# Patient Record
Sex: Female | Born: 1985 | Race: White | Hispanic: No | Marital: Single | Smoking: Current every day smoker
Health system: Southern US, Community
[De-identification: ages and names within clinical notes are randomized; demographics above are authoritative.]

## PROBLEM LIST (undated history)

## (undated) DIAGNOSIS — E041 Nontoxic single thyroid nodule: Secondary | ICD-10-CM

## (undated) DIAGNOSIS — C801 Malignant (primary) neoplasm, unspecified: Secondary | ICD-10-CM

## (undated) DIAGNOSIS — R51 Headache: Secondary | ICD-10-CM

## (undated) HISTORY — PX: WISDOM TOOTH EXTRACTION: SHX21

## (undated) HISTORY — PX: GALLBLADDER SURGERY: SHX652

---

## 2013-10-09 ENCOUNTER — Ambulatory Visit: Payer: Medicaid Other | Admitting: Endocrinology

## 2013-10-12 ENCOUNTER — Encounter: Payer: Self-pay | Admitting: Endocrinology

## 2013-10-12 ENCOUNTER — Ambulatory Visit (INDEPENDENT_AMBULATORY_CARE_PROVIDER_SITE_OTHER): Payer: Medicaid Other | Admitting: Endocrinology

## 2013-10-12 VITALS — BP 118/80 | HR 85 | Temp 98.0°F | Ht 64.5 in | Wt 191.0 lb

## 2013-10-12 DIAGNOSIS — E042 Nontoxic multinodular goiter: Secondary | ICD-10-CM | POA: Insufficient documentation

## 2013-10-12 DIAGNOSIS — F17200 Nicotine dependence, unspecified, uncomplicated: Secondary | ICD-10-CM

## 2013-10-12 LAB — TSH: TSH: 2.22 u[IU]/mL (ref 0.35–5.50)

## 2013-10-12 LAB — T4, FREE: FREE T4: 0.66 ng/dL (ref 0.60–1.60)

## 2013-10-12 NOTE — Patient Instructions (Signed)
Please sign a release of information, for the ultrasound done last year. blood tests are being requested for you today.  We'll contact you with results. If the nodules are really small, and the blood test is normal, no treatment is needed now. However, please return in 1 year.

## 2013-10-12 NOTE — Progress Notes (Signed)
   Subjective:    Patient ID: Lisa Kirby, female    DOB: [birth year], 28 y.o.   MRN: [id number]  HPI In early 2014, pt was noted on a routine visit to have a multinodular goiter.  She saw an endocrinologist in [place].  bx was advised, but did not return for f/u.  She had a blood test, but does not recall the result.  She has slight pain at the anterior neck and assoc mild dysphagia.  She has never been on thyroid medication.   No past medical history on file.  No past surgical history on file.  History   Social History  . Marital Status: Unknown    Spouse Name: N/A    Number of Children: N/A  . Years of Education: N/A   Occupational History  . Not on file.   Social History Main Topics  . Smoking status: Current Every Day Smoker  . Smokeless tobacco: Not on file  . Alcohol Use: Yes  . Drug Use: Not on file  . Sexual Activity: Not on file   Other Topics Concern  . Not on file   Social History Narrative  . No narrative on file    No current outpatient prescriptions on file prior to visit.   No current facility-administered medications on file prior to visit.    Not on File  No family history on file. No goiter or other thyroid problems. BP 118/80  Pulse 85  Temp(Src) 98 F (36.7 C) (Oral)  Ht 5' 4.5" (1.638 m)  Wt 191 lb (86.637 kg)  BMI 32.29 kg/m2  SpO2 98%  Review of Systems denies weight loss, hoarseness, visual loss, palpitations, sob, polyuria, myalgias, excessive diaphoresis, numbness, tremor, anxiety, easy bruising, and rhinorrhea.  She has headache, hair loss, and constipation.  She has regular menses.      Objective:   Physical Exam VS: see vs page GEN: no distress HEAD: head: no deformity eyes: no periorbital swelling, no proptosis external nose and ears are normal mouth: no lesion seen NECK: thyroid is slightly enlarged (R>L), but I cannot find a nodule. Surface is irregular.   CHEST WALL: no deformity LUNGS:  Clear to auscultation CV: reg  rate and rhythm, no murmur ABD: abdomen is soft, nontender.  no hepatosplenomegaly.  not distended.  no hernia MUSCULOSKELETAL: muscle bulk and strength are grossly normal.  no obvious joint swelling.  gait is normal and steady EXTEMITIES: no deformity. no edema PULSES: no carotid bruit NEURO:  cn 2-12 grossly intact.   readily moves all 4's.  sensation is intact to touch on all 4's.  SKIN:  Normal texture and temperature.  No rash or suspicious lesion is visible.   NODES:  None palpable at the neck PSYCH: alert, well-oriented.  Does not appear anxious nor depressed.  Lab Results  Component Value Date   TSH 2.22 10/12/2013  (I reviewed [place] report)    Assessment & Plan:  Multinodular goiter, due for recheck. Neck pain: not thyroid-related Hair loss: not thyroid-related

## 2013-10-20 ENCOUNTER — Other Ambulatory Visit: Payer: Self-pay | Admitting: Endocrinology

## 2013-10-20 ENCOUNTER — Ambulatory Visit
Admission: RE | Admit: 2013-10-20 | Discharge: 2013-10-20 | Disposition: A | Discharge status: Home / Self Care | Payer: Medicaid Other | Source: Ambulatory Visit | Attending: Endocrinology | Admitting: Endocrinology

## 2013-10-20 DIAGNOSIS — E042 Nontoxic multinodular goiter: Secondary | ICD-10-CM

## 2013-10-28 ENCOUNTER — Telehealth: Payer: Self-pay | Admitting: *Deleted

## 2013-10-29 ENCOUNTER — Telehealth: Payer: Self-pay

## 2013-10-29 NOTE — Telephone Encounter (Signed)
Pt called stating the referral for Thyroid Bx should have been sent to [facility].  Can this be changed? Thanks!

## 2013-10-29 NOTE — Telephone Encounter (Signed)
Contacted pt and informed via voicemail that her referral had been sent to [facility]. Left number for pt to call to see about appointment and instructed to call back if she had any questions.

## 2013-11-10 NOTE — Telephone Encounter (Signed)
Pt would like an urgent referral send to [facility] center regarding the biopsy please

## 2013-11-10 NOTE — Telephone Encounter (Signed)
Pt informed that since she has [place] [facility] the referral would not be able to be made at [facility] by our referral coordinators. Pt advised to check with her PCP about making the referral to [facility] medical. Pt to call back and inform us up her decision and what PCP says.

## 2013-11-20 ENCOUNTER — Telehealth: Payer: Self-pay | Admitting: *Deleted

## 2013-11-20 DIAGNOSIS — E042 Nontoxic multinodular goiter: Secondary | ICD-10-CM

## 2013-11-20 NOTE — Telephone Encounter (Signed)
Mandy with [facility] (pt's PCP office) called stating the physician received the copy of the thyroid U/S. Pt wants to have her thyroid biopsy done at [facility]. Can Dr Loanne Drilling please order this to be done there? Please advise.

## 2013-11-20 NOTE — Telephone Encounter (Signed)
done

## 2013-11-24 ENCOUNTER — Telehealth: Payer: Self-pay | Admitting: Endocrinology

## 2013-11-24 NOTE — Telephone Encounter (Signed)
Patient states that her Doctor Chalmers Guest told her that we would have to schedule her biopsy in [place]  Can you please advise patient  Call back: [phone]  Thank You

## 2013-11-24 NOTE — Telephone Encounter (Signed)
Requested call back from [place] to discuss.

## 2013-11-25 NOTE — Telephone Encounter (Signed)
Pt advised that if Bx is going to be done in [place] the referral must come from them and they must schedule it.

## 2013-11-26 ENCOUNTER — Telehealth: Payer: Self-pay | Admitting: Endocrinology

## 2013-11-26 DIAGNOSIS — E042 Nontoxic multinodular goiter: Secondary | ICD-10-CM

## 2013-11-26 NOTE — Telephone Encounter (Signed)
Left message for [facility] imaging to call back to reschedule appointment.

## 2013-11-26 NOTE — Telephone Encounter (Signed)
Patient would like to schedule her Biopsy here in [facility]   Please advise patient   [phone]  Thank You

## 2013-12-01 ENCOUNTER — Other Ambulatory Visit (HOSPITAL_COMMUNITY)
Admission: RE | Admit: 2013-12-01 | Discharge: 2013-12-01 | Disposition: A | Discharge status: Home / Self Care | Payer: Medicaid Other | Source: Ambulatory Visit | Attending: Interventional Radiology | Admitting: Interventional Radiology

## 2013-12-01 ENCOUNTER — Ambulatory Visit
Admission: RE | Admit: 2013-12-01 | Discharge: 2013-12-01 | Disposition: A | Discharge status: Home / Self Care | Payer: Medicaid Other | Source: Ambulatory Visit | Attending: Endocrinology | Admitting: Endocrinology

## 2013-12-01 DIAGNOSIS — E042 Nontoxic multinodular goiter: Secondary | ICD-10-CM | POA: Insufficient documentation

## 2013-12-04 NOTE — Telephone Encounter (Signed)
Pt called stating that she did not want to have another Bx done. Pt states that she would like to speak with you about the lobectomy before deciding.  Thanks!

## 2013-12-04 NOTE — Telephone Encounter (Signed)
i called pt.  i left message.  Ref surg

## 2013-12-17 ENCOUNTER — Encounter (INDEPENDENT_AMBULATORY_CARE_PROVIDER_SITE_OTHER): Payer: Self-pay | Admitting: General Surgery

## 2013-12-17 ENCOUNTER — Ambulatory Visit (INDEPENDENT_AMBULATORY_CARE_PROVIDER_SITE_OTHER): Payer: Medicaid Other | Admitting: General Surgery

## 2013-12-17 VITALS — BP 126/74 | HR 84 | Temp 97.4°F | Ht 64.0 in | Wt 191.0 lb

## 2013-12-17 DIAGNOSIS — E041 Nontoxic single thyroid nodule: Secondary | ICD-10-CM
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
## 2013-12-17 NOTE — Patient Instructions (Signed)
Thyroidectomy °Thyroidectomy is the removal of part or all of your thyroid gland. Your thyroid gland is a butterfly-shaped gland at the base of your neck. It produces a substance called thyroid hormone, which regulates the physical and chemical processes that keep your body functioning and make energy available to your body (metabolism). °The amount of thyroid gland tissue that is removed during a thyroidectomy depends on the reason for the procedure. Typically, if only a part of your gland is removed, enough thyroid gland tissue remains to maintain normal function. If your entire thyroid gland is removed or if the amount of thyroid gland tissue remaining is inadequate to maintain normal function, you will need life-long treatment with thyroid hormone on a daily basis. °Thyroidectomy maybe performed when you have the following conditions: °· Thyroid nodules. These are small, abnormal collections of tissue that form inside the thyroid gland. If these nodules begin to enlarge at a rapid rate, a sample of tissue from the nodule is taken through a needle and examined (needle biopsy). This is done to determine if the nodules are cancerous. Depending on the outcome of this exam, thyroidectomy may be necessary. °· Thyroid cancer. °· Goiter, which is an enlarged thyroid gland. All or part of the thyroid gland may be removed if the gland has become so large that it causes difficulty breathing or swallowing. °· Hyperthyroidism. This is when the thyroid gland produces too much thyroid hormone. Hypothyroidism can cause symptoms of fluctuating weight, intolerance to heat, irritability, shortness of breath, and chest pain. °LET YOUR CAREGIVER KNOW ABOUT:  °· Allergies to food or medicine. °· Medicines that you are taking, including vitamins, herbs, eyedrops, over-the-counter medicines, and creams. °· Previous problems you have had with anesthetics or numbing medicines. °· History of bleeding problems or blood clots. °· Previous  surgeries you have had. °· Other health problems, including diabetes and kidney problems, you have had. °· Possibility of pregnancy, if this applies. °BEFORE THE PROCEDURE  °· Do not eat or drink anything, including water, for at least 6 hours before the procedure. °· Ask your caregiver whether you should stop taking certain medicines before the day of the procedure. °PROCEDURE  °There are different ways that thyroidectomy is performed. For each type, you will be given a medicine to make you sleep (general anesthetic). The three main types of thyroidectomy are listed as follows: °· Conventional thyroidectomy A cut (incision) in the center portion of your lower neck is made with a scalpel. Muscles below your skin are separated to gain access to your thyroid gland. Your thyroid gland is dissected from your windpipe (trachea). Often a drain is placed at the incision site to drain any blood that accumulates under the skin after the procedure. This drain will be removed before you go home. The wound from the incision should heal within 2 weeks. °· Endoscopic thyroidectomy Small incisions are made in your lower neck. A small instrument (endoscope) is inserted under your skin at the incision sites. The endoscope used for thyroidectomy consists of 2 flexible tubes. Inside one of the tubes is a video camera that is used to guide the surgeon. Tools to remove the thyroid gland, including a tool to cut the gland (dissectors) and a suction device, are inserted through the other tube. The surgeon uses the dissectors to dissect the thyroid gland from the trachea and remove it. °· Robotic thyroidectomy This procedure allows your thyroid gland to be removed through incisions in your armpit, your chest, or high in your   neck. Instruments similar to endoscopes provide a 3-dimensional picture of the surgical site. Dissecting instruments are controlled by devices similar to joysticks. These devices allow more accurate manipulation of the  instruments. After the blood supply to the gland is removed, the gland is cut into several pieces and removed through the incisions. °RISKS AND COMPLICATIONS °Complications associated with thyroidectomy are rare, but they can occur. Possible complications include: °· A decrease in parathyroid hormone levels (hypoparathyroidism) Your parathyroid glands are located close behind your thyroid gland. They are responsible for maintaining calcium levels inthe body. If they are damaged or removed, levels of calcium in the blood become low and nerves become irritable, which can cause muscle spasms. Medicines are available to treat this. °· Bacterial infection This can often be treated with medicines that kill bacteria (antibiotics). °· Damage to your voice box nerves This could cause hoarseness or complete loss of voice. °· Bleeding or airway obstruction. °AFTER THE PROCEDURE  °· You will rest in the recovery room as you wake up. °· When you first wake up, your throat may feel slightly sore. °· You will not be allowed to eat or drink until instructed otherwise. °· You will be taken to your hospital room. You will usually stay at the hospital for 1 or 2 nights. °· If a drain is placed during the procedure, it usually is removed the next day. °· You may have some mild neck pain. °· Your voice may be weak. This usually is temporary. °Document Released: 01/02/2001 Document Revised: 11/03/2012 Document Reviewed: 10/11/2010 °Exit[employer]® Patient Information ©2014 [employer]. ° °

## 2013-12-21 ENCOUNTER — Encounter (HOSPITAL_COMMUNITY): Payer: Self-pay | Admitting: Pharmacy Technician

## 2013-12-22 ENCOUNTER — Other Ambulatory Visit (HOSPITAL_COMMUNITY): Payer: Self-pay | Admitting: Anesthesiology

## 2013-12-22 NOTE — Patient Instructions (Signed)
Your procedure is scheduled on:  12/29/13  TUESDAY  Report to [facility] at   [facility]    AM.   Call this number if you have problems the morning of surgery: [phone]        Do not eat food  Or drink :After Midnight. Monday NIGHT   Take these medicines the morning of surgery with A SIP OF WATER:none   .  Contacts, dentures or partial plates, or metal hairpins  can not be worn to surgery. Your family will be responsible for glasses, dentures, hearing aides while you are in surgery  Leave suitcase in the car. After surgery it may be brought to your room.  For patients admitted to the hospital, checkout time is 11:00 AM day of  discharge.         [facility] IS NOT RESPONSIBLE FOR ANY VALUABLES                                                                                                                                 - Preparing for Surgery Before surgery, you can play an important role.  Because skin is not sterile, your skin needs to be as free of germs as possible.  You can reduce the number of germs on your skin by washing with CHG (chlorahexidine gluconate) soap before surgery.  CHG is an antiseptic cleaner which kills germs and bonds with the skin to continue killing germs even after washing. Please DO NOT use if you have an allergy to CHG or antibacterial soaps.  If your skin becomes reddened/irritated stop using the CHG and inform your nurse when you arrive at Short Stay. Do not shave (including legs and underarms) for at least 48 hours prior to the first CHG shower.  You may shave your face/neck. Please follow these instructions carefully:  1.  Shower with CHG Soap the night before surgery and the  morning of Surgery.  2.  If you choose to wash your hair, wash your hair first as usual with your  normal  shampoo.  3.  After you shampoo, rinse your hair and body thoroughly to remove the   shampoo.                           4.  Use CHG as you would any other liquid soap.  You can apply chg directly  to the skin and wash                       Gently with a scrungie or clean washcloth.  5.  Apply the CHG Soap to your body ONLY FROM THE NECK DOWN.   Do not use on face/ open  Wound or open sores. Avoid contact with eyes, ears mouth and genitals (private parts).                       Wash face,  Genitals (private parts) with your normal soap.             6.  Wash thoroughly, paying special attention to the area where your surgery  will be performed.  7.  Thoroughly rinse your body with warm water from the neck down.  8.  DO NOT shower/wash with your normal soap after using and rinsing off  the CHG Soap.                9.  Pat yourself dry with a clean towel.            10.  Wear clean pajamas.            11.  Place clean sheets on your bed the night of your first shower and do not  sleep with pets. Day of Surgery : Do not apply any lotions/deodorants the morning of surgery.  Please wear clean clothes to the hospital/surgery center.  FAILURE TO FOLLOW THESE INSTRUCTIONS MAY RESULT IN THE CANCELLATION OF YOUR SURGERY PATIENT SIGNATURE_________________________________  NURSE SIGNATURE__________________________________  ________________________________________________________________________

## 2013-12-23 ENCOUNTER — Ambulatory Visit (HOSPITAL_COMMUNITY)
Admission: RE | Admit: 2013-12-23 | Discharge: 2013-12-23 | Disposition: A | Discharge status: Home / Self Care | Payer: Medicaid Other | Source: Ambulatory Visit | Attending: Anesthesiology | Admitting: Anesthesiology

## 2013-12-23 ENCOUNTER — Encounter (HOSPITAL_COMMUNITY)
Admission: RE | Admit: 2013-12-23 | Discharge: 2013-12-23 | Disposition: A | Discharge status: Home / Self Care | Payer: Medicaid Other | Source: Ambulatory Visit | Attending: General Surgery | Admitting: General Surgery

## 2013-12-23 ENCOUNTER — Encounter (HOSPITAL_COMMUNITY): Payer: Self-pay

## 2013-12-23 DIAGNOSIS — Z01818 Encounter for other preprocedural examination: Secondary | ICD-10-CM | POA: Insufficient documentation

## 2013-12-23 DIAGNOSIS — Z01812 Encounter for preprocedural laboratory examination: Secondary | ICD-10-CM | POA: Insufficient documentation

## 2013-12-23 HISTORY — DX: Nontoxic single thyroid nodule: E04.1

## 2013-12-23 LAB — CBC WITH DIFFERENTIAL/PLATELET
BASOS ABS: 0 10*3/uL (ref 0.0–0.1)
BASOS PCT: 0 % (ref 0–1)
EOS ABS: 0.1 10*3/uL (ref 0.0–0.7)
EOS PCT: 1 % (ref 0–5)
HCT: 39.1 % (ref 36.0–46.0)
Hemoglobin: 13.1 g/dL (ref 12.0–15.0)
Lymphocytes Relative: 29 % (ref 12–46)
Lymphs Abs: 4 10*3/uL (ref 0.7–4.0)
MCH: 30 pg (ref 26.0–34.0)
MCHC: 33.5 g/dL (ref 30.0–36.0)
MCV: 89.5 fL (ref 78.0–100.0)
MONO ABS: 0.6 10*3/uL (ref 0.1–1.0)
Monocytes Relative: 4 % (ref 3–12)
NEUTROS ABS: 9 10*3/uL — AB (ref 1.7–7.7)
Neutrophils Relative %: 66 % (ref 43–77)
Platelets: 304 10*3/uL (ref 150–400)
RBC: 4.37 MIL/uL (ref 3.87–5.11)
RDW: 13 % (ref 11.5–15.5)
WBC: 13.7 10*3/uL — ABNORMAL HIGH (ref 4.0–10.5)

## 2013-12-23 LAB — COMPREHENSIVE METABOLIC PANEL
ALBUMIN: 3.6 g/dL (ref 3.5–5.2)
ALT: 9 U/L (ref 0–35)
AST: 15 U/L (ref 0–37)
Alkaline Phosphatase: 81 U/L (ref 39–117)
BUN: 10 mg/dL (ref 6–23)
CALCIUM: 9.4 mg/dL (ref 8.4–10.5)
CO2: 23 mEq/L (ref 19–32)
CREATININE: 0.61 mg/dL (ref 0.50–1.10)
Chloride: 103 mEq/L (ref 96–112)
GFR calc Af Amer: >90 mL/min (ref >90)
GFR calc non Af Amer: >90 mL/min (ref >90)
Glucose, Bld: 115 mg/dL — ABNORMAL HIGH (ref 70–99)
Potassium: 4.5 mEq/L (ref 3.7–5.3)
Sodium: 139 mEq/L (ref 137–147)
TOTAL PROTEIN: 7.4 g/dL (ref 6.0–8.3)
Total Bilirubin: 0.2 mg/dL — ABNORMAL LOW (ref 0.3–1.2)

## 2013-12-23 LAB — HCG, SERUM, QUALITATIVE: PREG SERUM: NEGATIVE

## 2013-12-23 NOTE — Progress Notes (Signed)
Per epic note, cbc was reviewed by dr Redmond Pulling at 10:19 am

## 2013-12-29 ENCOUNTER — Encounter (HOSPITAL_COMMUNITY): Payer: Self-pay | Admitting: Anesthesiology

## 2013-12-29 ENCOUNTER — Encounter (HOSPITAL_COMMUNITY)
Admission: RE | Disposition: A | Discharge status: Home / Self Care | Payer: Self-pay | Source: Ambulatory Visit | Attending: General Surgery

## 2013-12-29 ENCOUNTER — Ambulatory Visit (HOSPITAL_COMMUNITY): Payer: Medicaid Other | Admitting: Anesthesiology

## 2013-12-29 ENCOUNTER — Encounter (HOSPITAL_COMMUNITY): Payer: Medicaid Other | Admitting: Anesthesiology

## 2013-12-29 ENCOUNTER — Observation Stay (HOSPITAL_COMMUNITY)
Admission: RE | Admit: 2013-12-29 | Discharge: 2013-12-30 | Disposition: A | Discharge status: Home / Self Care | Payer: Medicaid Other | Source: Ambulatory Visit | Attending: General Surgery | Admitting: General Surgery

## 2013-12-29 DIAGNOSIS — C73 Malignant neoplasm of thyroid gland: Principal | ICD-10-CM | POA: Insufficient documentation

## 2013-12-29 DIAGNOSIS — F17200 Nicotine dependence, unspecified, uncomplicated: Secondary | ICD-10-CM | POA: Insufficient documentation

## 2013-12-29 DIAGNOSIS — E041 Nontoxic single thyroid nodule: Secondary | ICD-10-CM | POA: Diagnosis present

## 2013-12-29 HISTORY — PX: THYROID LOBECTOMY: SHX420

## 2013-12-29 SURGERY — LOBECTOMY, THYROID
Anesthesia: General | Laterality: Right

## 2013-12-29 MED ORDER — CEFAZOLIN SODIUM-DEXTROSE 2-3 GM-% IV SOLR
INTRAVENOUS | Status: AC
  Filled 2013-12-29: qty 50

## 2013-12-29 MED ORDER — LACTATED RINGERS IV SOLN
INTRAVENOUS | Status: DC | PRN
  Administered 2013-12-29 (×2): via INTRAVENOUS

## 2013-12-29 MED ORDER — NEOSTIGMINE METHYLSULFATE 10 MG/10ML IV SOLN
INTRAVENOUS | Status: DC | PRN
  Administered 2013-12-29: 3 mg via INTRAVENOUS

## 2013-12-29 MED ORDER — MORPHINE SULFATE 2 MG/ML IJ SOLN
1.0000 mg | INTRAMUSCULAR | Status: DC | PRN
  Administered 2013-12-29 – 2013-12-30 (×4): 2 mg via INTRAVENOUS
  Filled 2013-12-29 (×4): qty 1

## 2013-12-29 MED ORDER — ACETAMINOPHEN 325 MG PO TABS: 650.0000 mg | ORAL_TABLET | Freq: Four times a day (QID) | ORAL | Status: DC | PRN

## 2013-12-29 MED ORDER — HYDROCODONE-ACETAMINOPHEN 5-325 MG PO TABS
1.0000 | ORAL_TABLET | ORAL | Status: DC | PRN
  Administered 2013-12-29: 1 via ORAL
  Filled 2013-12-29: qty 1

## 2013-12-29 MED ORDER — PROPOFOL 10 MG/ML IV BOLUS
INTRAVENOUS | Status: AC
  Filled 2013-12-29: qty 20

## 2013-12-29 MED ORDER — ONDANSETRON HCL 4 MG/2ML IJ SOLN: 4.0000 mg | Freq: Four times a day (QID) | INTRAMUSCULAR | Status: DC | PRN

## 2013-12-29 MED ORDER — LIDOCAINE HCL 4 % MT SOLN
OROMUCOSAL | Status: DC | PRN
  Administered 2013-12-29: 4 mL via TOPICAL

## 2013-12-29 MED ORDER — PROPOFOL 10 MG/ML IV BOLUS
INTRAVENOUS | Status: DC | PRN
  Administered 2013-12-29: 150 mg via INTRAVENOUS

## 2013-12-29 MED ORDER — ACETAMINOPHEN 10 MG/ML IV SOLN
1000.0000 mg | Freq: Once | INTRAVENOUS | Status: AC
  Administered 2013-12-29: 1000 mg via INTRAVENOUS
  Filled 2013-12-29: qty 100

## 2013-12-29 MED ORDER — PHENYLEPHRINE HCL 10 MG/ML IJ SOLN
INTRAMUSCULAR | Status: DC | PRN
  Administered 2013-12-29: 40 ug via INTRAVENOUS

## 2013-12-29 MED ORDER — ONDANSETRON HCL 4 MG/2ML IJ SOLN
INTRAMUSCULAR | Status: DC | PRN
  Administered 2013-12-29: 4 mg via INTRAVENOUS

## 2013-12-29 MED ORDER — ONDANSETRON HCL 4 MG PO TABS: 4.0000 mg | ORAL_TABLET | Freq: Four times a day (QID) | ORAL | Status: DC | PRN

## 2013-12-29 MED ORDER — GLYCOPYRROLATE 0.2 MG/ML IJ SOLN
INTRAMUSCULAR | Status: AC
  Filled 2013-12-29: qty 2

## 2013-12-29 MED ORDER — MIDAZOLAM HCL 5 MG/5ML IJ SOLN
INTRAMUSCULAR | Status: DC | PRN
  Administered 2013-12-29: 2 mg via INTRAVENOUS

## 2013-12-29 MED ORDER — PROMETHAZINE HCL 25 MG/ML IJ SOLN: 6.2500 mg | INTRAMUSCULAR | Status: DC | PRN

## 2013-12-29 MED ORDER — GLYCOPYRROLATE 0.2 MG/ML IJ SOLN
INTRAMUSCULAR | Status: DC | PRN
  Administered 2013-12-29: 0.4 mg via INTRAVENOUS

## 2013-12-29 MED ORDER — LIDOCAINE HCL (CARDIAC) 20 MG/ML IV SOLN
INTRAVENOUS | Status: DC | PRN
  Administered 2013-12-29: 40 mg via INTRAVENOUS

## 2013-12-29 MED ORDER — ROCURONIUM BROMIDE 100 MG/10ML IV SOLN
INTRAVENOUS | Status: AC
  Filled 2013-12-29: qty 1

## 2013-12-29 MED ORDER — ROCURONIUM BROMIDE 100 MG/10ML IV SOLN
INTRAVENOUS | Status: DC | PRN
  Administered 2013-12-29: 50 mg via INTRAVENOUS

## 2013-12-29 MED ORDER — FENTANYL CITRATE 0.05 MG/ML IJ SOLN
INTRAMUSCULAR | Status: AC
  Filled 2013-12-29: qty 5

## 2013-12-29 MED ORDER — NEOSTIGMINE METHYLSULFATE 10 MG/10ML IV SOLN
INTRAVENOUS | Status: AC
  Filled 2013-12-29: qty 1

## 2013-12-29 MED ORDER — LIDOCAINE HCL (CARDIAC) 20 MG/ML IV SOLN
INTRAVENOUS | Status: AC
  Filled 2013-12-29: qty 5

## 2013-12-29 MED ORDER — PHENOL 1.4 % MT LIQD
1.0000 | OROMUCOSAL | Status: DC | PRN
  Administered 2013-12-29 (×3): 1 via OROMUCOSAL
  Filled 2013-12-29: qty 177

## 2013-12-29 MED ORDER — MIDAZOLAM HCL 2 MG/2ML IJ SOLN
INTRAMUSCULAR | Status: AC
  Filled 2013-12-29: qty 2

## 2013-12-29 MED ORDER — MICROFIBRILLAR COLL HEMOSTAT EX PADS
MEDICATED_PAD | CUTANEOUS | Status: DC | PRN
  Administered 2013-12-29: 1 via TOPICAL

## 2013-12-29 MED ORDER — ONDANSETRON HCL 4 MG/2ML IJ SOLN
INTRAMUSCULAR | Status: AC
  Filled 2013-12-29: qty 2

## 2013-12-29 MED ORDER — HYDROMORPHONE HCL PF 1 MG/ML IJ SOLN
INTRAMUSCULAR | Status: DC | PRN
  Administered 2013-12-29: 0.5 mg via INTRAVENOUS
  Administered 2013-12-29: 1 mg via INTRAVENOUS
  Administered 2013-12-29: 0.5 mg via INTRAVENOUS

## 2013-12-29 MED ORDER — ENOXAPARIN SODIUM 40 MG/0.4ML ~~LOC~~ SOLN
40.0000 mg | SUBCUTANEOUS | Status: DC
  Administered 2013-12-30: 40 mg via SUBCUTANEOUS
  Filled 2013-12-29 (×2): qty 0.4

## 2013-12-29 MED ORDER — HYDROMORPHONE HCL PF 2 MG/ML IJ SOLN
INTRAMUSCULAR | Status: AC
  Filled 2013-12-29: qty 1

## 2013-12-29 MED ORDER — DEXAMETHASONE SODIUM PHOSPHATE 10 MG/ML IJ SOLN
INTRAMUSCULAR | Status: DC | PRN
  Administered 2013-12-29: 10 mg via INTRAVENOUS

## 2013-12-29 MED ORDER — FENTANYL CITRATE 0.05 MG/ML IJ SOLN
INTRAMUSCULAR | Status: AC
  Filled 2013-12-29: qty 2

## 2013-12-29 MED ORDER — CEFAZOLIN SODIUM-DEXTROSE 2-3 GM-% IV SOLR
2.0000 g | INTRAVENOUS | Status: AC
  Administered 2013-12-29: 2 g via INTRAVENOUS

## 2013-12-29 MED ORDER — BUPIVACAINE HCL (PF) 0.25 % IJ SOLN
INTRAMUSCULAR | Status: AC
  Filled 2013-12-29: qty 30

## 2013-12-29 MED ORDER — HYDROMORPHONE HCL PF 1 MG/ML IJ SOLN
0.2500 mg | INTRAMUSCULAR | Status: DC | PRN
  Administered 2013-12-29: 0.5 mg via INTRAVENOUS

## 2013-12-29 MED ORDER — HYDROMORPHONE HCL PF 1 MG/ML IJ SOLN
INTRAMUSCULAR | Status: AC
  Filled 2013-12-29: qty 1

## 2013-12-29 MED ORDER — POTASSIUM CHLORIDE IN NACL 20-0.9 MEQ/L-% IV SOLN
INTRAVENOUS | Status: DC
  Administered 2013-12-29: 14:00:00 via INTRAVENOUS
  Filled 2013-12-29 (×2): qty 1000

## 2013-12-29 MED ORDER — MENTHOL 3 MG MT LOZG
1.0000 | LOZENGE | OROMUCOSAL | Status: DC | PRN
  Filled 2013-12-29: qty 9

## 2013-12-29 MED ORDER — 0.9 % SODIUM CHLORIDE (POUR BTL) OPTIME
TOPICAL | Status: DC | PRN
  Administered 2013-12-29: 1000 mL

## 2013-12-29 MED ORDER — FENTANYL CITRATE 0.05 MG/ML IJ SOLN
INTRAMUSCULAR | Status: DC | PRN
  Administered 2013-12-29 (×3): 50 ug via INTRAVENOUS
  Administered 2013-12-29: 100 ug via INTRAVENOUS
  Administered 2013-12-29 (×2): 25 ug via INTRAVENOUS
  Administered 2013-12-29: 50 ug via INTRAVENOUS

## 2013-12-29 MED ORDER — BUPIVACAINE HCL (PF) 0.25 % IJ SOLN
INTRAMUSCULAR | Status: DC | PRN
  Administered 2013-12-29: 6 mL

## 2013-12-29 SURGICAL SUPPLY — 47 items
ATTRACTOMAT 16X20 MAGNETIC DRP (DRAPES) ×3 IMPLANT
BENZOIN TINCTURE PRP APPL 2/3 (GAUZE/BANDAGES/DRESSINGS) ×3 IMPLANT
BLADE HEX COATED 2.75 (ELECTRODE) ×3 IMPLANT
BLADE SURG 15 STRL LF DISP TIS (BLADE) ×1 IMPLANT
BLADE SURG 15 STRL SS (BLADE) ×2
CANISTER SUCTION 2500CC (MISCELLANEOUS) ×3 IMPLANT
CHLORAPREP W/TINT 10.5 ML (MISCELLANEOUS) ×3 IMPLANT
CLIP TI MEDIUM 6 (CLIP) ×6 IMPLANT
CLIP TI WIDE RED SMALL 6 (CLIP) ×15 IMPLANT
CLOSURE WOUND 1/2 X4 (GAUZE/BANDAGES/DRESSINGS) ×1
DISSECTOR ROUND CHERRY 3/8 STR (MISCELLANEOUS) ×3 IMPLANT
DRAPE PED LAPAROTOMY (DRAPES) ×3 IMPLANT
DRAPE UTILITY XL STRL (DRAPES) ×3 IMPLANT
DRESSING SURGICEL FIBRLLR 1X2 (HEMOSTASIS) ×1 IMPLANT
DRSG SURGICEL FIBRILLAR 1X2 (HEMOSTASIS) ×3
DRSG TEGADERM 4X4.75 (GAUZE/BANDAGES/DRESSINGS) ×3 IMPLANT
ELECT COATED BLADE 2.86 ST (ELECTRODE) IMPLANT
ELECT REM PT RETURN 9FT ADLT (ELECTROSURGICAL) ×3
ELECTRODE REM PT RTRN 9FT ADLT (ELECTROSURGICAL) ×1 IMPLANT
GAUZE SPONGE 4X4 16PLY XRAY LF (GAUZE/BANDAGES/DRESSINGS) ×3 IMPLANT
GLOVE BIOGEL M STRL SZ7.5 (GLOVE) IMPLANT
GLOVE BIOGEL PI IND STRL 7.0 (GLOVE) ×1 IMPLANT
GLOVE BIOGEL PI INDICATOR 7.0 (GLOVE) ×2
GLOVE INDICATOR 8.0 STRL GRN (GLOVE) ×3 IMPLANT
GLOVE SURG SIGNA 7.5 PF LTX (GLOVE) ×3 IMPLANT
GOWN STRL REUS W/TWL LRG LVL3 (GOWN DISPOSABLE) ×3 IMPLANT
GOWN STRL REUS W/TWL XL LVL3 (GOWN DISPOSABLE) ×6 IMPLANT
KIT BASIN OR (CUSTOM PROCEDURE TRAY) ×3 IMPLANT
MARKER SKIN DUAL TIP RULER LAB (MISCELLANEOUS) ×3 IMPLANT
NEEDLE HYPO 22GX1.5 SAFETY (NEEDLE) ×3 IMPLANT
NS IRRIG 1000ML POUR BTL (IV SOLUTION) ×3 IMPLANT
PACK BASIC VI WITH GOWN DISP (CUSTOM PROCEDURE TRAY) ×3 IMPLANT
PENCIL BUTTON HOLSTER BLD 10FT (ELECTRODE) ×3 IMPLANT
SHEARS HARMONIC 9CM CVD (BLADE) ×3 IMPLANT
SPONGE GAUZE 4X4 12PLY (GAUZE/BANDAGES/DRESSINGS) ×3 IMPLANT
STAPLER VISISTAT 35W (STAPLE) ×3 IMPLANT
STRIP CLOSURE SKIN 1/2X4 (GAUZE/BANDAGES/DRESSINGS) ×2 IMPLANT
SUT MNCRL AB 4-0 PS2 18 (SUTURE) ×3 IMPLANT
SUT SILK 2 0 (SUTURE) ×2
SUT SILK 2-0 18XBRD TIE 12 (SUTURE) ×1 IMPLANT
SUT SILK 3 0 (SUTURE)
SUT SILK 3-0 18XBRD TIE 12 (SUTURE) IMPLANT
SUT VIC AB 3-0 SH 18 (SUTURE) ×3 IMPLANT
SYR BULB IRRIGATION 50ML (SYRINGE) ×3 IMPLANT
SYR CONTROL 10ML LL (SYRINGE) ×3 IMPLANT
TOWEL OR 17X26 10 PK STRL BLUE (TOWEL DISPOSABLE) ×3 IMPLANT
YANKAUER SUCT BULB TIP 10FT TU (MISCELLANEOUS) ×3 IMPLANT

## 2013-12-29 NOTE — Anesthesia Preprocedure Evaluation (Addendum)
Anesthesia Evaluation  Patient identified by MRN, date of birth, ID band Patient awake    Reviewed: Allergy & Precautions, H&P , NPO status , Patient's Chart, lab work & pertinent test results  Airway Mallampati: II TM Distance: >3 FB Neck ROM: Full    Dental no notable dental hx.    Pulmonary Current Smoker,  breath sounds clear to auscultation  Pulmonary exam normal       Cardiovascular negative cardio ROS  Rhythm:Regular Rate:Normal     Neuro/Psych negative neurological ROS  negative psych ROS   GI/Hepatic negative GI ROS, Neg liver ROS,   Endo/Other  negative endocrine ROS  Renal/GU negative Renal ROS  negative genitourinary   Musculoskeletal negative musculoskeletal ROS (+)   Abdominal   Peds negative pediatric ROS (+)  Hematology negative hematology ROS (+)   Anesthesia Other Findings   Reproductive/Obstetrics negative OB ROS                          Anesthesia Physical Anesthesia Plan  ASA: II  Anesthesia Plan: General   Post-op Pain Management:    Induction: Intravenous  Airway Management Planned: Oral ETT  Additional Equipment:   Intra-op Plan:   Post-operative Plan: Extubation in OR  Informed Consent: I have reviewed the patients History and Physical, chart, labs and discussed the procedure including the risks, benefits and alternatives for the proposed anesthesia with the patient or authorized representative who has indicated his/her understanding and acceptance.   Dental advisory given  Plan Discussed with: CRNA and Surgeon  Anesthesia Plan Comments:         Anesthesia Quick Evaluation

## 2013-12-29 NOTE — Anesthesia Postprocedure Evaluation (Signed)
  Anesthesia Post-op Note  Patient: Lisa Kirby  Procedure(s) Performed: Procedure(s) (LRB): RIGHT THYROID LOBECTOMY (Right)  Patient Location: PACU  Anesthesia Type: General  Level of Consciousness: awake and alert   Airway and Oxygen Therapy: Patient Spontanous Breathing  Post-op Pain: mild  Post-op Assessment: Post-op Vital signs reviewed, Patient's Cardiovascular Status Stable, Respiratory Function Stable, Patent Airway and No signs of Nausea or vomiting  Last Vitals:  Filed Vitals:   12/29/13 1000  BP: 138/68  Pulse: 68  Temp:   Resp: 22    Post-op Vital Signs: stable   Complications: No apparent anesthesia complications
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
## 2013-12-29 NOTE — Brief Op Note (Signed)
12/29/2013  9:31 AM  PATIENT:  Lisa Kirby  28 y.o. female  PRE-OPERATIVE DIAGNOSIS:  right thyroid nodule  POST-OPERATIVE DIAGNOSIS:  right thyroid nodule  PROCEDURE:  Procedure(s): RIGHT THYROID LOBECTOMY (Right)  SURGEON:  Surgeon(s) and Role:    * Gayland Curry, MD - Primary    * Earnstine Regal, MD - Assisting  PHYSICIAN ASSISTANT: Ruben Im, PA-S  ASSISTANTS: see above   ANESTHESIA:   general  EBL:  Total I/O In: 1000 [I.V.:1000] Out: 10 [Blood:10]  BLOOD ADMINISTERED:none  DRAINS: none   LOCAL MEDICATIONS USED:  MARCAINE     SPECIMEN:  Source of Specimen:  rt thyroid, stitch superior pole  DISPOSITION OF SPECIMEN:  PATHOLOGY  COUNTS:  YES  TOURNIQUET:  * No tourniquets in log *  DICTATION: .Other Dictation: Dictation Number [id number]  PLAN OF CARE: Admit for overnight observation  PATIENT DISPOSITION:  PACU - hemodynamically stable.   Delay start of Pharmacological VTE agent (>24hrs) due to surgical blood loss or risk of bleeding: no  Leighton Ruff. Redmond Pulling, MD, FACS General, Bariatric, & Minimally Invasive Surgery [facility], [place]

## 2013-12-29 NOTE — Interval H&P Note (Signed)
History and Physical Interval Note:  12/29/2013 7:22 AM  Lisa Kirby  has presented today for surgery, with the diagnosis of right thyroid nodule  The various methods of treatment have been discussed with the patient and family. After consideration of risks, benefits and other options for treatment, the patient has consented to  Procedure(s): RIGHT THYROID LOBECTOMY (Right) as a surgical intervention .  The patient's history has been reviewed, patient examined, no change in status, stable for surgery.  I have reviewed the patient's chart and labs.  Questions were answered to the patient's satisfaction.    Leighton Ruff. Redmond Pulling, MD, [place], Bariatric, & Minimally Invasive Surgery [facility], [place]   Gayland Curry

## 2013-12-29 NOTE — Transfer of Care (Signed)
Immediate Anesthesia Transfer of Care Note  Patient: Lisa Kirby  Procedure(s) Performed: Procedure(s): RIGHT THYROID LOBECTOMY (Right)  Patient Location: PACU  Anesthesia Type:General  Level of Consciousness: awake, alert  and oriented  Airway & Oxygen Therapy: Patient Spontanous Breathing and Patient connected to face mask oxygen  Post-op Assessment: Report given to PACU RN and Post -op Vital signs reviewed and stable  Post vital signs: Reviewed and stable  Complications: No apparent anesthesia complications

## 2013-12-30 ENCOUNTER — Encounter (HOSPITAL_COMMUNITY): Payer: Self-pay | Admitting: *Deleted

## 2013-12-30 LAB — BASIC METABOLIC PANEL
BUN: 8 mg/dL (ref 6–23)
CALCIUM: 8.6 mg/dL (ref 8.4–10.5)
CHLORIDE: 104 meq/L (ref 96–112)
CO2: 20 meq/L (ref 19–32)
Creatinine, Ser: 0.55 mg/dL (ref 0.50–1.10)
GFR calc Af Amer: >90 mL/min (ref >90)
GFR calc non Af Amer: >90 mL/min (ref >90)
GLUCOSE: 119 mg/dL — AB (ref 70–99)
Potassium: 4.1 mEq/L (ref 3.7–5.3)
Sodium: 139 mEq/L (ref 137–147)

## 2013-12-30 MED ORDER — OXYCODONE-ACETAMINOPHEN 5-325 MG PO TABS: 1.0000 | ORAL_TABLET | Freq: Four times a day (QID) | ORAL | Status: DC | PRN

## 2013-12-30 NOTE — Discharge Summary (Signed)
Physician Discharge Summary  Lisa Kirby WNI:[id number] DOB: 10/29/1985 DOA: 12/29/2013  PCP: Chalmers Guest, FNP  Admit date: 12/29/2013 Discharge date: 12/30/2013  Recommendations for Outpatient Follow-up:   Follow-up Information   Follow up with Gayland Curry, MD On 01/14/2014. (10:15 AM , For wound re-check)    Specialty:  General Surgery   Contact information:   [place] [address] [phone]      Discharge Diagnoses:  Patient Active Problem List   Diagnosis Date Noted  . Thyroid nodule 12/29/2013  . Right thyroid nodule 12/17/2013  . Multinodular goiter 10/12/2013  . Smoker 10/12/2013     Surgical Procedure: Right thyroid lobectomy  Discharge Condition: good Disposition: home  Diet recommendation: regular  Filed Weights   12/30/13 0546  Weight: 190 lb (86.183 kg)    Hospital Course:  Patient underwent right thyroid lobectomy and kept overnight for observation. No issues. Tolerated diet. No voice changes. No sign of swelling. Ambulated in halls. Pain controlled. Vitals stable.    Discharge Instructions  Discharge Instructions   Discharge instructions    Complete by:  As directed   See [facility] discharge instructions     Increase activity slowly    Complete by:  As directed             Medication List    STOP taking these medications       acetaminophen 325 MG tablet  Commonly known as:  TYLENOL      TAKE these medications       etonogestrel-ethinyl estradiol 0.12-0.015 MG/24HR vaginal ring  Commonly known as:  [place] 1 each vaginally every 28 (twenty-eight) days. Insert vaginally and leave in place for 3 consecutive weeks, then remove for 1 week.     ibuprofen 800 MG tablet  Commonly known as:  ADVIL,MOTRIN  Take 800 mg by mouth every 8 (eight) hours as needed for mild pain or moderate pain.     oxyCODONE-acetaminophen 5-325 MG per tablet  Commonly known as:  ROXICET  Take 1-2 tablets by mouth every 6  (six) hours as needed for severe pain.           Follow-up Information   Follow up with Gayland Curry, MD On 01/14/2014. (10:15 AM , For wound re-check)    Specialty:  General Surgery   Contact information:   [place] [address] [phone]        The results of significant diagnostics from this hospitalization (including imaging, microbiology, ancillary and laboratory) are listed below for reference.    Significant Diagnostic Studies: Dg Chest 2 View  12/23/2013   CLINICAL DATA:  Preop evaluation.  EXAM: CHEST  2 VIEW  COMPARISON:  None.  FINDINGS: The heart size and mediastinal contours are within normal limits. Both lungs are clear. The visualized skeletal structures are unremarkable.  IMPRESSION: No active cardiopulmonary disease.   Electronically Signed   By: Margaree Mackintosh M.D.   On: 12/23/2013 08:46   US Thyroid Biopsy  12/01/2013   INDICATION: Enlarging indeterminate thyroid nodule  EXAM: ULTRASOUND GUIDED FINE NEEDLE ASPIRATION OF INDETERMINATE THYROID NODULE  COMPARISON:  US SOFT TISSUE HEAD/NECK dated 10/20/2013  MEDICATIONS: None  COMPLICATIONS: None immediate  TECHNIQUE: Informed written consent was obtained from the patient after a discussion of the risks, benefits and alternatives to treatment. Questions regarding the procedure were encouraged and answered. A timeout was performed prior to the initiation of the procedure.  Pre-procedural ultrasound scanning  demonstrated grossly unchanged appearance of mixed echogenic dominant approximately 1.8 cm solid nodule within in the inferior pole the right lobe of the thyroid.  The procedure was planned. The neck was prepped in the usual sterile fashion, and a sterile drape was applied covering the operative field. A timeout was performed prior to the initiation of the procedure. Local anesthesia was provided with 1% lidocaine.  Under direct ultrasound guidance, 4 FNA biopsies were performed of the dominant  nodule within the inferior pole the right lobe of the thyroid with a 25 gauge needle. The samples were prepared and submitted to pathology.  Limited post procedural scanning was negative for hematoma or additional complication. Dressings were placed. The patient tolerated the above procedures procedure well without immediate postprocedural complication.  IMPRESSION: Technically successful ultrasound guided fine needle aspiration of dominant nodule within the inferior pole of the right lobe of the thyroid.   Electronically Signed   By: Sandi Mariscal M.D.   On: 12/01/2013 10:16    Microbiology: No results found for this or any previous visit (from the past 240 hour(s)).   Labs: Basic Metabolic Panel:  Recent Labs Lab 12/23/13 0830 12/30/13 0426  NA 139 139  K 4.5 4.1  CL 103 104  CO2 23 20  GLUCOSE 115* 119*  BUN 10 8  CREATININE 0.61 0.55  CALCIUM 9.4 8.6   Liver Function Tests:  Recent Labs Lab 12/23/13 0830  AST 15  ALT 9  ALKPHOS 81  BILITOT 0.2*  PROT 7.4  ALBUMIN 3.6   No results found for this basename: LIPASE, AMYLASE,  in the last 168 hours No results found for this basename: AMMONIA,  in the last 168 hours CBC:  Recent Labs Lab 12/23/13 0830  WBC 13.7*  NEUTROABS 9.0*  HGB 13.1  HCT 39.1  MCV 89.5  PLT 304   Cardiac Enzymes: No results found for this basename: CKTOTAL, CKMB, CKMBINDEX, TROPONINI,  in the last 168 hours BNP: BNP (last 3 results) No results found for this basename: PROBNP,  in the last 8760 hours CBG: No results found for this basename: GLUCAP,  in the last 168 hours  Active Problems:   Thyroid nodule   Time coordinating discharge: 10 minutes  Signed:  Gayland Curry, MD [facility], [place] [phone] 12/30/2013, 8:27 AM

## 2013-12-30 NOTE — Discharge Instructions (Signed)
[facility], [place] [phone]  THYROID/ PARATHYROID SURGERY: POST OP INSTRUCTIONS  Always review your discharge instruction sheet given to you by the facility where your surgery was performed.  IF YOU HAVE DISABILITY OR FAMILY LEAVE FORMS, YOU MUST BRING THEM TO THE OFFICE FOR PROCESSING.  PLEASE DO NOT GIVE THEM TO YOUR DOCTOR.  1. A prescription for pain medication may be given to you upon discharge.  Take your pain medication as prescribed, if needed.  If narcotic pain medicine is not needed, then you may take acetaminophen (Tylenol) or ibuprofen (Advil) as needed. 2. Take your usually prescribed medications unless otherwise directed. 3. If you need a refill on your pain medication, please contact your pharmacy. They will contact our office to request authorization.  Prescriptions will not be filled after 5pm or on week-ends. 4. You should follow a light diet the first 24 hours after arrival home, such as soup and crackers, etc.  Be sure to include lots of fluids daily.  Resume your normal diet the day after surgery. 5. Most patients will experience some swelling and bruising on the chest and neck area.  Ice packs will help.  Swelling and bruising can take several days to resolve.  6. It is common to experience some constipation if taking pain medication after surgery.  Increasing fluid intake and taking a stool softener will usually help or prevent this problem from occurring.  A mild laxative (Milk of Magnesia or Miralax) should be taken according to package directions if there are no bowel movements after 48 hours. 7. Unless discharge instructions indicate otherwise, you may remove your bandages 48 hours after surgery, and you may shower at that time.  You  have steri-strips (small skin tapes) in place directly over the incision.  These strips should be left on the skin for 7-10 days. . 8. ACTIVITIES:  You may resume regular (light) daily activities beginning the next  day--such as daily self-care, walking, climbing stairs--gradually increasing activities as tolerated.  You may have sexual intercourse when it is comfortable.  Refrain from any heavy lifting or straining until approved by your doctor. a. You may drive when you no longer are taking prescription pain medication, you can comfortably wear a seatbelt, and you can safely maneuver your car and apply brakes b. RETURN TO WORK:  __________________________________________________________ 9. You should see your doctor in the office for a follow-up appointment approximately two weeks after your surgery.  Make sure that you call for this appointment within a day or two after you arrive home to insure a convenient appointment time. OTHER INSTRUCTIONS:    WHEN TO CALL YOUR DOCTOR: 1. Fever over 101.0 2. Inability to urinate 3. Nausea and/or vomiting 4. Extreme swelling or bruising 5. Continued bleeding from incision. 6. Increased pain, redness, or drainage from the incision. 7. Difficulty swallowing or breathing 8. Muscle cramping or spasms. 9. Numbness or tingling in hands or feet or around lips.  The clinic staff is available to answer your questions during regular business hours.  Please dont hesitate to call and ask to speak to one of the nurses if you have concerns.  For further questions, please visit [email or web address]

## 2013-12-30 NOTE — Progress Notes (Signed)
12/30/13  Reviewed discharge instructions with patient.  Patient verbalized understanding of discharge instructions.  Copy of discharge instructions and prescriptions given to patient.

## 2014-01-05 ENCOUNTER — Telehealth (INDEPENDENT_AMBULATORY_CARE_PROVIDER_SITE_OTHER): Payer: Self-pay

## 2014-01-05 NOTE — Telephone Encounter (Signed)
Pathology called wanting to make you aware that the pt's thyroid nodule came back as a "malignant process". They will have the report ready in a few days and fax it in to [place].

## 2014-01-06 ENCOUNTER — Telehealth (INDEPENDENT_AMBULATORY_CARE_PROVIDER_SITE_OTHER): Payer: Self-pay | Admitting: General Surgery

## 2014-01-06 ENCOUNTER — Other Ambulatory Visit (INDEPENDENT_AMBULATORY_CARE_PROVIDER_SITE_OTHER): Payer: Self-pay | Admitting: General Surgery

## 2014-01-06 NOTE — Telephone Encounter (Signed)
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 Size of tumor was 2.2 cm.given the size of lesion as well as presence of a contralateral nodule and the need for postoperative radioactive iodine I have recommended a completion thyroidectomy. We discussed the diagnosis of papillary cancer. I explained long-term survival. We also briefly discussed need for completion thyroidectomy. We also did briefly discuss surgical course as well as postoperative course with this. She is scheduled to see me next week in the clinic we'll we will discuss it further. However as stated I would like to schedule her surgery and our schedulers will contact her in the next day or 2 to get her on the schedule for week after next. Voiced understanding.

## 2014-01-08 ENCOUNTER — Encounter (HOSPITAL_COMMUNITY): Payer: Self-pay | Admitting: Pharmacy Technician

## 2014-01-13 ENCOUNTER — Encounter (HOSPITAL_COMMUNITY): Payer: Self-pay

## 2014-01-13 ENCOUNTER — Encounter (HOSPITAL_COMMUNITY)
Admission: RE | Admit: 2014-01-13 | Discharge: 2014-01-13 | Disposition: A | Discharge status: Home / Self Care | Payer: Medicaid Other | Source: Ambulatory Visit | Attending: General Surgery | Admitting: General Surgery

## 2014-01-13 DIAGNOSIS — Z01812 Encounter for preprocedural laboratory examination: Secondary | ICD-10-CM | POA: Insufficient documentation

## 2014-01-13 DIAGNOSIS — Z01818 Encounter for other preprocedural examination: Secondary | ICD-10-CM | POA: Insufficient documentation

## 2014-01-13 HISTORY — DX: Headache: R51

## 2014-01-13 HISTORY — DX: Malignant (primary) neoplasm, unspecified: C80.1

## 2014-01-13 LAB — BASIC METABOLIC PANEL
BUN: 8 mg/dL (ref 6–23)
CO2: 24 mEq/L (ref 19–32)
CREATININE: 0.62 mg/dL (ref 0.50–1.10)
Calcium: 9 mg/dL (ref 8.4–10.5)
Chloride: 106 mEq/L (ref 96–112)
GFR calc Af Amer: >90 mL/min (ref >90)
GFR calc non Af Amer: >90 mL/min (ref >90)
GLUCOSE: 92 mg/dL (ref 70–99)
POTASSIUM: 4.3 meq/L (ref 3.7–5.3)
Sodium: 143 mEq/L (ref 137–147)

## 2014-01-13 LAB — CBC WITH DIFFERENTIAL/PLATELET
BASOS PCT: 0 % (ref 0–1)
Basophils Absolute: 0 10*3/uL (ref 0.0–0.1)
EOS ABS: 0.2 10*3/uL (ref 0.0–0.7)
Eosinophils Relative: 2 % (ref 0–5)
HEMATOCRIT: 38 % (ref 36.0–46.0)
HEMOGLOBIN: 12.7 g/dL (ref 12.0–15.0)
Lymphocytes Relative: 36 % (ref 12–46)
Lymphs Abs: 3.5 10*3/uL (ref 0.7–4.0)
MCH: 29.5 pg (ref 26.0–34.0)
MCHC: 33.4 g/dL (ref 30.0–36.0)
MCV: 88.4 fL (ref 78.0–100.0)
MONO ABS: 0.5 10*3/uL (ref 0.1–1.0)
MONOS PCT: 5 % (ref 3–12)
Neutro Abs: 5.7 10*3/uL (ref 1.7–7.7)
Neutrophils Relative %: 57 % (ref 43–77)
Platelets: 360 10*3/uL (ref 150–400)
RBC: 4.3 MIL/uL (ref 3.87–5.11)
RDW: 13 % (ref 11.5–15.5)
WBC: 9.9 10*3/uL (ref 4.0–10.5)

## 2014-01-13 LAB — HCG, SERUM, QUALITATIVE: Preg, Serum: NEGATIVE

## 2014-01-13 NOTE — Patient Instructions (Signed)
YOUR SURGERY IS SCHEDULED AT [facility]  ON:  Wednesday  7/1  REPORT TO  SHORT STAY CENTER AT:  6:30 AM   PLEASE COME IN THE [place] ENTRANCE AND FOLLOW SIGNS TO SHORT STAY CENTER.  DO NOT EAT OR DRINK ANYTHING AFTER MIDNIGHT THE NIGHT BEFORE YOUR SURGERY.  YOU MAY BRUSH YOUR TEETH, RINSE OUT YOUR MOUTH--BUT NO WATER, NO FOOD, NO CHEWING GUM, NO MINTS, NO CANDIES, NO CHEWING TOBACCO.  PLEASE TAKE THE FOLLOWING MEDICATIONS THE AM OF YOUR SURGERY WITH A FEW SIPS OF WATER:  NO MEDS TO TAKE   DO NOT BRING VALUABLES, MONEY, CREDIT CARDS.  DO NOT WEAR JEWELRY, MAKE-UP, NAIL POLISH AND NO METAL PINS OR CLIPS IN YOUR HAIR. CONTACT LENS, DENTURES / PARTIALS, GLASSES SHOULD NOT BE WORN TO SURGERY AND IN MOST CASES-HEARING AIDS WILL NEED TO BE REMOVED.  BRING YOUR GLASSES CASE, ANY EQUIPMENT NEEDED FOR YOUR CONTACT LENS. FOR PATIENTS ADMITTED TO THE HOSPITAL--CHECK OUT TIME THE DAY OF DISCHARGE IS 11:00 AM.  ALL INPATIENT ROOMS ARE PRIVATE - WITH BATHROOM, TELEPHONE, TELEVISION AND WIFI INTERNET.                                                     PLEASE BE AWARE THAT YOU MAY NEED ADDITIONAL BLOOD DRAWN DAY OF YOUR SURGERY  _______________________________________________________________________   [facility] - Preparing for Surgery Before surgery, you can play an important role.  Because skin is not sterile, your skin needs to be as free of germs as possible.  You can reduce the number of germs on your skin by washing with CHG (chlorahexidine gluconate) soap before surgery.  CHG is an antiseptic cleaner which kills germs and bonds with the skin to continue killing germs even after washing. Please DO NOT use if you have an allergy to CHG or antibacterial soaps.  If your skin becomes reddened/irritated stop using the CHG and inform your nurse when you arrive at Short Stay. Do not shave (including legs and underarms) for at least 48 hours prior to the first CHG shower.  You  may shave your face/neck. Please follow these instructions carefully:  1.  Shower with CHG Soap the night before surgery and the  morning of Surgery.  2.  If you choose to wash your hair, wash your hair first as usual with your  normal  shampoo.  3.  After you shampoo, rinse your hair and body thoroughly to remove the  shampoo.                           4.  Use CHG as you would any other liquid soap.  You can apply chg directly  to the skin and wash                       Gently with a scrungie or clean washcloth.  5.  Apply the CHG Soap to your body ONLY FROM THE NECK DOWN.   Do not use on face/ open                           Wound or open sores. Avoid contact with eyes, ears mouth and genitals (private parts).  Wash face,  Genitals (private parts) with your normal soap.             6.  Wash thoroughly, paying special attention to the area where your surgery  will be performed.  7.  Thoroughly rinse your body with warm water from the neck down.  8.  DO NOT shower/wash with your normal soap after using and rinsing off  the CHG Soap.                9.  Pat yourself dry with a clean towel.            10.  Wear clean pajamas.            11.  Place clean sheets on your bed the night of your first shower and do not  sleep with pets. Day of Surgery : Do not apply any lotions/deodorants the morning of surgery.  Please wear clean clothes to the hospital/surgery center.  FAILURE TO FOLLOW THESE INSTRUCTIONS MAY RESULT IN THE CANCELLATION OF YOUR SURGERY PATIENT SIGNATURE_________________________________  NURSE SIGNATURE__________________________________  ________________________________________________________________________

## 2014-01-13 NOTE — Pre-Procedure Instructions (Signed)
CXR REPORT IN EPIC FROM 12/23/13

## 2014-01-14 ENCOUNTER — Ambulatory Visit (INDEPENDENT_AMBULATORY_CARE_PROVIDER_SITE_OTHER): Payer: Medicaid Other | Admitting: General Surgery

## 2014-01-14 ENCOUNTER — Encounter (INDEPENDENT_AMBULATORY_CARE_PROVIDER_SITE_OTHER): Payer: Self-pay | Admitting: General Surgery

## 2014-01-14 VITALS — BP 130/72 | HR 70 | Temp 98.6°F | Resp 16 | Ht 64.0 in | Wt 189.2 lb

## 2014-01-14 DIAGNOSIS — C73 Malignant neoplasm of thyroid gland: Secondary | ICD-10-CM | POA: Insufficient documentation

## 2014-01-14 DIAGNOSIS — E042 Nontoxic multinodular goiter: Secondary | ICD-10-CM

## 2014-01-14 NOTE — Progress Notes (Signed)
Patient ID: Lisa Kirby, female   DOB: [birth year], 28 y.o.   MRN: [id number]  Chief Complaint  Patient presents with  . Follow-up    HPI Lisa Kirby is a 28 y.o. female.   HPI 28 year old Caucasian female comes in for her postoperative appointment after undergoing a right thyroid lobectomy for an enlarging right thyroid nodule. Her pathology report came back as follicular variant of papillary thyroid cancer, 2.2 cm, noninvasive, encapsulated. She states that she is feeling well. She denies any fever or chills. She denies any difficulty swallowing. She states her voice sounds normal to her. She reports a good appetite. She denies any diarrhea or constipation; however, she was constipated for several days after discharge from the hospital. She is here to talk about her pathology report more in detail as well as upcoming additional surgery Past Medical History  Diagnosis Date  . Right thyroid nodule     REMOVED SURGICALLY 12/29/13 - DX WITH PAPILLARY THYROID CANCER - IS SCHEDULED FOR COMPLETION THYROIDECTOMY  . Headache(784.0)     NOT MIGRAINES  . Cancer     PAPILLARY THYROID CANCER    Past Surgical History  Procedure Laterality Date  . Cesarean section    . Gallbladder surgery    . Wisdom tooth extraction    . Thyroid lobectomy Right 12/29/2013    Procedure: RIGHT THYROID LOBECTOMY;  Surgeon: Gayland Curry, MD;  Location: [facility];  Service: General;  Laterality: Right;    Family History  Problem Relation Age of Onset  . Hypertension Father   . Heart disease Father     Social History History  Substance Use Topics  . Smoking status: Current Every Day Smoker -- 0.50 packs/day for 9 years    Types: Cigarettes  . Smokeless tobacco: Never Used  . Alcohol Use: No    No Known Allergies  Current Outpatient Prescriptions  Medication Sig Dispense Refill  . etonogestrel-ethinyl estradiol (NUVARING) 0.12-0.015 MG/24HR vaginal ring Place 1 each vaginally every 28 (twenty-eight) days.  Insert vaginally and leave in place for 3 consecutive weeks, then remove for 1 week.      Marland Kitchen ibuprofen (ADVIL,MOTRIN) 800 MG tablet Take 800 mg by mouth every 8 (eight) hours as needed for mild pain or moderate pain.      Marland Kitchen oxyCODONE-acetaminophen (ROXICET) 5-325 MG per tablet Take 1-2 tablets by mouth every 6 (six) hours as needed for severe pain.  40 tablet  0   No current facility-administered medications for this visit.    Review of Systems Review of Systems  Constitutional: Negative for fever, chills and unexpected weight change.  HENT: Negative for congestion, drooling, hearing loss, sore throat, trouble swallowing and voice change.   Eyes: Negative for visual disturbance.  Respiratory: Negative for cough and wheezing.   Cardiovascular: Negative for chest pain, palpitations and leg swelling.  Gastrointestinal: Negative for nausea, vomiting, abdominal pain, diarrhea, constipation, blood in stool, abdominal distention and anal bleeding.  Genitourinary: Negative for hematuria, vaginal bleeding and difficulty urinating.  Musculoskeletal: Negative for arthralgias.  Skin: Negative for rash and wound.  Neurological: Negative for seizures, syncope and headaches.  Hematological: Negative for adenopathy. Does not bruise/bleed easily.  Psychiatric/Behavioral: Negative for confusion.    Blood pressure 130/72, pulse 70, temperature 98.6 F (37 C), temperature source Temporal, resp. rate 16, height 5\' 4"  (1.626 m), weight 189 lb 3.2 oz (85.821 kg), last menstrual period 01/09/2014.  Physical Exam Physical Exam  Vitals reviewed. Constitutional: She is oriented to person, place, and  time. She appears well-developed and well-nourished. No distress.  HENT:  Head: Normocephalic and atraumatic.  Right Ear: External ear normal.  Left Ear: External ear normal.  Eyes: Conjunctivae are normal. No scleral icterus.  Neck: Normal range of motion. Neck supple. No tracheal deviation present. No  thyromegaly present.    Transverse kocher neck incision -no cellulitis, induration or fluctuance. Scar is a little bit raised. No sign of hematoma. No lymphadenopathy.  Cardiovascular: Normal rate and normal heart sounds.   Pulmonary/Chest: Effort normal and breath sounds normal. No stridor. No respiratory distress. She has no wheezes.  Abdominal: She exhibits no distension.  Musculoskeletal: She exhibits no edema and no tenderness.  Lymphadenopathy:    She has no cervical adenopathy.  Neurological: She is alert and oriented to person, place, and time. She exhibits normal muscle tone.  Skin: Skin is warm and dry. No rash noted. She is not diaphoretic. No erythema.  Psychiatric: She has a normal mood and affect. Her behavior is normal. Judgment and thought content normal.    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 I explained that up to 50% of contralateral nodules may have cancer within them. I explained that she does not need a node dissection. She agrees with completion thyroidectomy. I explained it would be very similar to her initial surgery. We rediscussed risks and benefits including but not limited to bleeding, infection, injury to surrounding structures such as recurrent laryngeal nerve, hoarseness, hematoma, anesthesia issues, blood clot formation. We discussed the typical postoperative course. We discussed potential injury to the parathyroid glands. We discussed the need for lifelong thyroid hormone replacement. We discussed that depending on her final path report she may need additional treatment. We also discussed that she will need routine oncologic  surveillance. She is currently scheduled for completion thyroidectomy next Wednesday. All of her questions were asked and answered. I encouraged her to contact the office should she have any additional questions. I did explain that she is slightly at increased risk for infection and other complications of since this is  repeat neck surgery; however, we did not explore the contralateral side so I think it is minimal.  Leighton Ruff. Redmond Pulling, MD, FACS General, Bariatric, & Minimally Invasive Surgery [facility], [place]        [facility] M 01/14/2014, 6:48 PM

## 2014-01-20 ENCOUNTER — Encounter (HOSPITAL_COMMUNITY)
Admission: RE | Disposition: A | Discharge status: Home / Self Care | Payer: Self-pay | Source: Ambulatory Visit | Attending: General Surgery

## 2014-01-20 ENCOUNTER — Encounter (HOSPITAL_COMMUNITY): Payer: Medicaid Other | Admitting: *Deleted

## 2014-01-20 ENCOUNTER — Encounter (HOSPITAL_COMMUNITY): Payer: Self-pay | Admitting: *Deleted

## 2014-01-20 ENCOUNTER — Observation Stay (HOSPITAL_COMMUNITY)
Admission: RE | Admit: 2014-01-20 | Discharge: 2014-01-21 | Disposition: A | Discharge status: Home / Self Care | Payer: Medicaid Other | Source: Ambulatory Visit | Attending: General Surgery | Admitting: General Surgery

## 2014-01-20 ENCOUNTER — Ambulatory Visit (HOSPITAL_COMMUNITY): Payer: Medicaid Other | Admitting: *Deleted

## 2014-01-20 DIAGNOSIS — Z9889 Other specified postprocedural states: Secondary | ICD-10-CM

## 2014-01-20 DIAGNOSIS — F17200 Nicotine dependence, unspecified, uncomplicated: Secondary | ICD-10-CM | POA: Diagnosis not present

## 2014-01-20 DIAGNOSIS — E890 Postprocedural hypothyroidism: Secondary | ICD-10-CM

## 2014-01-20 DIAGNOSIS — C73 Malignant neoplasm of thyroid gland: Principal | ICD-10-CM | POA: Insufficient documentation

## 2014-01-20 DIAGNOSIS — E041 Nontoxic single thyroid nodule: Secondary | ICD-10-CM | POA: Diagnosis present

## 2014-01-20 HISTORY — PX: THYROIDECTOMY: SHX17

## 2014-01-20 SURGERY — THYROIDECTOMY
Anesthesia: General | Site: Neck

## 2014-01-20 MED ORDER — PROMETHAZINE HCL 25 MG/ML IJ SOLN: 6.2500 mg | INTRAMUSCULAR | Status: DC | PRN

## 2014-01-20 MED ORDER — CEFAZOLIN SODIUM-DEXTROSE 2-3 GM-% IV SOLR
2.0000 g | INTRAVENOUS | Status: AC
  Administered 2014-01-20: 2 g via INTRAVENOUS

## 2014-01-20 MED ORDER — DIPHENHYDRAMINE HCL 50 MG/ML IJ SOLN: 12.5000 mg | Freq: Three times a day (TID) | INTRAMUSCULAR | Status: DC | PRN

## 2014-01-20 MED ORDER — DEXAMETHASONE SODIUM PHOSPHATE 10 MG/ML IJ SOLN
INTRAMUSCULAR | Status: DC | PRN
  Administered 2014-01-20: 5 mg via INTRAVENOUS

## 2014-01-20 MED ORDER — METOCLOPRAMIDE HCL 5 MG/ML IJ SOLN
INTRAMUSCULAR | Status: DC | PRN
  Administered 2014-01-20: 10 mg via INTRAVENOUS

## 2014-01-20 MED ORDER — ONDANSETRON HCL 4 MG/2ML IJ SOLN
INTRAMUSCULAR | Status: AC
  Filled 2014-01-20: qty 2

## 2014-01-20 MED ORDER — ROCURONIUM BROMIDE 100 MG/10ML IV SOLN
INTRAVENOUS | Status: DC | PRN
  Administered 2014-01-20: 10 mg via INTRAVENOUS
  Administered 2014-01-20: 40 mg via INTRAVENOUS

## 2014-01-20 MED ORDER — HYDROMORPHONE HCL PF 1 MG/ML IJ SOLN
INTRAMUSCULAR | Status: AC
  Filled 2014-01-20: qty 1

## 2014-01-20 MED ORDER — PROPOFOL 10 MG/ML IV BOLUS
INTRAVENOUS | Status: AC
  Filled 2014-01-20: qty 20

## 2014-01-20 MED ORDER — ONDANSETRON HCL 4 MG/2ML IJ SOLN: 4.0000 mg | Freq: Four times a day (QID) | INTRAMUSCULAR | Status: DC | PRN

## 2014-01-20 MED ORDER — FENTANYL CITRATE 0.05 MG/ML IJ SOLN
INTRAMUSCULAR | Status: AC
  Filled 2014-01-20: qty 5

## 2014-01-20 MED ORDER — HYDROMORPHONE HCL PF 1 MG/ML IJ SOLN
0.2500 mg | INTRAMUSCULAR | Status: DC | PRN
  Administered 2014-01-20 (×4): 0.5 mg via INTRAVENOUS

## 2014-01-20 MED ORDER — LIDOCAINE HCL (CARDIAC) 20 MG/ML IV SOLN
INTRAVENOUS | Status: DC | PRN
  Administered 2014-01-20: 50 mg via INTRAVENOUS

## 2014-01-20 MED ORDER — KCL IN DEXTROSE-NACL 20-5-0.45 MEQ/L-%-% IV SOLN
INTRAVENOUS | Status: DC
  Administered 2014-01-20: 13:00:00 via INTRAVENOUS
  Filled 2014-01-20 (×2): qty 1000

## 2014-01-20 MED ORDER — ENOXAPARIN SODIUM 40 MG/0.4ML ~~LOC~~ SOLN
40.0000 mg | SUBCUTANEOUS | Status: DC
Start: 2014-01-21 — End: 2014-01-21
  Administered 2014-01-21: 40 mg via SUBCUTANEOUS
  Filled 2014-01-20: qty 0.4

## 2014-01-20 MED ORDER — PROPOFOL 10 MG/ML IV BOLUS
INTRAVENOUS | Status: DC | PRN
  Administered 2014-01-20: 170 mg via INTRAVENOUS

## 2014-01-20 MED ORDER — FENTANYL CITRATE 0.05 MG/ML IJ SOLN
INTRAMUSCULAR | Status: DC | PRN
  Administered 2014-01-20: 50 ug via INTRAVENOUS
  Administered 2014-01-20: 100 ug via INTRAVENOUS
  Administered 2014-01-20: 25 ug via INTRAVENOUS
  Administered 2014-01-20: 50 ug via INTRAVENOUS
  Administered 2014-01-20: 100 ug via INTRAVENOUS
  Administered 2014-01-20: 50 ug via INTRAVENOUS
  Administered 2014-01-20: 100 ug via INTRAVENOUS
  Administered 2014-01-20: 25 ug via INTRAVENOUS

## 2014-01-20 MED ORDER — MIDAZOLAM HCL 2 MG/2ML IJ SOLN
INTRAMUSCULAR | Status: AC
  Filled 2014-01-20: qty 2

## 2014-01-20 MED ORDER — MIDAZOLAM HCL 5 MG/5ML IJ SOLN
INTRAMUSCULAR | Status: DC | PRN
  Administered 2014-01-20: 2 mg via INTRAVENOUS

## 2014-01-20 MED ORDER — NEOSTIGMINE METHYLSULFATE 10 MG/10ML IV SOLN
INTRAVENOUS | Status: AC
  Filled 2014-01-20: qty 1

## 2014-01-20 MED ORDER — ONDANSETRON HCL 4 MG PO TABS: 4.0000 mg | ORAL_TABLET | Freq: Four times a day (QID) | ORAL | Status: DC | PRN

## 2014-01-20 MED ORDER — ONDANSETRON HCL 4 MG/2ML IJ SOLN
INTRAMUSCULAR | Status: DC | PRN
  Administered 2014-01-20: 4 mg via INTRAVENOUS

## 2014-01-20 MED ORDER — NEOSTIGMINE METHYLSULFATE 10 MG/10ML IV SOLN
INTRAVENOUS | Status: DC | PRN
  Administered 2014-01-20: 4 mg via INTRAVENOUS

## 2014-01-20 MED ORDER — OXYCODONE-ACETAMINOPHEN 5-325 MG PO TABS
1.0000 | ORAL_TABLET | ORAL | Status: DC | PRN
  Administered 2014-01-20 – 2014-01-21 (×3): 1 via ORAL
  Filled 2014-01-20 (×3): qty 1

## 2014-01-20 MED ORDER — 0.9 % SODIUM CHLORIDE (POUR BTL) OPTIME
TOPICAL | Status: DC | PRN
  Administered 2014-01-20: 1000 mL

## 2014-01-20 MED ORDER — ESMOLOL HCL 10 MG/ML IV SOLN
INTRAVENOUS | Status: DC | PRN
  Administered 2014-01-20: 30 ug via INTRAVENOUS

## 2014-01-20 MED ORDER — CEFAZOLIN SODIUM-DEXTROSE 2-3 GM-% IV SOLR
INTRAVENOUS | Status: AC
  Filled 2014-01-20: qty 50

## 2014-01-20 MED ORDER — GLYCOPYRROLATE 0.2 MG/ML IJ SOLN
INTRAMUSCULAR | Status: DC | PRN
  Administered 2014-01-20: .6 mg via INTRAVENOUS

## 2014-01-20 MED ORDER — ROCURONIUM BROMIDE 100 MG/10ML IV SOLN
INTRAVENOUS | Status: AC
  Filled 2014-01-20: qty 1

## 2014-01-20 MED ORDER — MORPHINE SULFATE 2 MG/ML IJ SOLN: 1.0000 mg | INTRAMUSCULAR | Status: DC | PRN

## 2014-01-20 MED ORDER — GLYCOPYRROLATE 0.2 MG/ML IJ SOLN
INTRAMUSCULAR | Status: AC
  Filled 2014-01-20: qty 4

## 2014-01-20 MED ORDER — METOCLOPRAMIDE HCL 5 MG/ML IJ SOLN
INTRAMUSCULAR | Status: AC
  Filled 2014-01-20: qty 2

## 2014-01-20 MED ORDER — LIDOCAINE HCL (CARDIAC) 20 MG/ML IV SOLN
INTRAVENOUS | Status: AC
  Filled 2014-01-20: qty 5

## 2014-01-20 MED ORDER — DEXAMETHASONE SODIUM PHOSPHATE 10 MG/ML IJ SOLN
INTRAMUSCULAR | Status: AC
  Filled 2014-01-20: qty 1

## 2014-01-20 MED ORDER — LACTATED RINGERS IV SOLN
INTRAVENOUS | Status: DC | PRN
  Administered 2014-01-20 (×2): via INTRAVENOUS

## 2014-01-20 MED ORDER — PHENOL 1.4 % MT LIQD
1.0000 | OROMUCOSAL | Status: DC | PRN
  Filled 2014-01-20: qty 177

## 2014-01-20 MED ORDER — PROPOFOL 10 MG/ML IV BOLUS
INTRAVENOUS | Status: AC
Start: 2014-01-20 — End: 2014-01-20
  Filled 2014-01-20: qty 20

## 2014-01-20 SURGICAL SUPPLY — 48 items
ATTRACTOMAT 16X20 MAGNETIC DRP (DRAPES) ×3 IMPLANT
BENZOIN TINCTURE PRP APPL 2/3 (GAUZE/BANDAGES/DRESSINGS) ×3 IMPLANT
BLADE HEX COATED 2.75 (ELECTRODE) ×3 IMPLANT
BLADE SURG 15 STRL LF DISP TIS (BLADE) ×2 IMPLANT
BLADE SURG 15 STRL SS (BLADE) ×4
CANISTER SUCTION 2500CC (MISCELLANEOUS) IMPLANT
CHLORAPREP W/TINT 10.5 ML (MISCELLANEOUS) ×3 IMPLANT
CLIP TI MEDIUM 6 (CLIP) ×6 IMPLANT
CLIP TI WIDE RED SMALL 6 (CLIP) ×12 IMPLANT
CLOSURE WOUND 1/2 X4 (GAUZE/BANDAGES/DRESSINGS) ×1
DISSECTOR ROUND CHERRY 3/8 STR (MISCELLANEOUS) ×3 IMPLANT
DRAPE PED LAPAROTOMY (DRAPES) ×3 IMPLANT
DRAPE UTILITY XL STRL (DRAPES) ×3 IMPLANT
DRESSING SURGICEL FIBRLLR 1X2 (HEMOSTASIS) ×1 IMPLANT
DRSG SURGICEL FIBRILLAR 1X2 (HEMOSTASIS) ×3
DRSG TEGADERM 4X4.75 (GAUZE/BANDAGES/DRESSINGS) ×3 IMPLANT
ELECT COATED BLADE 2.86 ST (ELECTRODE) ×3 IMPLANT
ELECT REM PT RETURN 9FT ADLT (ELECTROSURGICAL) ×3
ELECTRODE REM PT RTRN 9FT ADLT (ELECTROSURGICAL) ×1 IMPLANT
GAUZE SPONGE 4X4 12PLY STRL (GAUZE/BANDAGES/DRESSINGS) ×3 IMPLANT
GAUZE SPONGE 4X4 16PLY XRAY LF (GAUZE/BANDAGES/DRESSINGS) ×6 IMPLANT
GLOVE BIOGEL M STRL SZ7.5 (GLOVE) IMPLANT
GLOVE BIOGEL PI IND STRL 7.0 (GLOVE) ×1 IMPLANT
GLOVE BIOGEL PI INDICATOR 7.0 (GLOVE) ×2
GLOVE INDICATOR 8.0 STRL GRN (GLOVE) ×3 IMPLANT
GLOVE SURG SIGNA 7.5 PF LTX (GLOVE) ×3 IMPLANT
GOWN STRL REUS W/TWL LRG LVL3 (GOWN DISPOSABLE) ×3 IMPLANT
GOWN STRL REUS W/TWL XL LVL3 (GOWN DISPOSABLE) ×6 IMPLANT
KIT BASIN OR (CUSTOM PROCEDURE TRAY) ×3 IMPLANT
MARKER SKIN DUAL TIP RULER LAB (MISCELLANEOUS) ×3 IMPLANT
NEEDLE HYPO 22GX1.5 SAFETY (NEEDLE) ×3 IMPLANT
NS IRRIG 1000ML POUR BTL (IV SOLUTION) ×3 IMPLANT
PACK BASIC VI WITH GOWN DISP (CUSTOM PROCEDURE TRAY) ×3 IMPLANT
PENCIL BUTTON HOLSTER BLD 10FT (ELECTRODE) ×3 IMPLANT
SHEARS HARMONIC 9CM CVD (BLADE) ×3 IMPLANT
SPONGE GAUZE 4X4 12PLY (GAUZE/BANDAGES/DRESSINGS) ×2 IMPLANT
STAPLER VISISTAT 35W (STAPLE) ×3 IMPLANT
STRIP CLOSURE SKIN 1/2X4 (GAUZE/BANDAGES/DRESSINGS) ×2 IMPLANT
SUT MNCRL AB 4-0 PS2 18 (SUTURE) ×3 IMPLANT
SUT SILK 2 0 (SUTURE) ×2
SUT SILK 2-0 18XBRD TIE 12 (SUTURE) ×1 IMPLANT
SUT SILK 3 0 (SUTURE)
SUT SILK 3-0 18XBRD TIE 12 (SUTURE) IMPLANT
SUT VIC AB 3-0 SH 18 (SUTURE) ×3 IMPLANT
SYR BULB IRRIGATION 50ML (SYRINGE) ×3 IMPLANT
SYR CONTROL 10ML LL (SYRINGE) ×3 IMPLANT
TOWEL OR 17X26 10 PK STRL BLUE (TOWEL DISPOSABLE) ×3 IMPLANT
YANKAUER SUCT BULB TIP 10FT TU (MISCELLANEOUS) ×3 IMPLANT

## 2014-01-20 NOTE — Progress Notes (Signed)
Pt complaining of IV being uncomfortable and hurting. After repositioning, offering to restart new site, and a heat pack, pt states " she wants it out" Paged MD on call. Orders received to DC IV. Will continue to monitor pt.

## 2014-01-20 NOTE — Anesthesia Postprocedure Evaluation (Signed)
  Anesthesia Post-op Note  Patient: Lisa Kirby  Procedure(s) Performed: Procedure(s) (LRB): COMPLETION THYROIDECTOMY (N/A)  Patient Location: PACU  Anesthesia Type: General  Level of Consciousness: awake and alert   Airway and Oxygen Therapy: Patient Spontanous Breathing  Post-op Pain: mild  Post-op Assessment: Post-op Vital signs reviewed, Patient's Cardiovascular Status Stable, Respiratory Function Stable, Patent Airway and No signs of Nausea or vomiting  Last Vitals:  Filed Vitals:   01/20/14 1115  BP: 134/72  Pulse: 65  Temp: 36.7 C  Resp: 21    Post-op Vital Signs: stable   Complications: No apparent anesthesia complications

## 2014-01-20 NOTE — Transfer of Care (Signed)
Immediate Anesthesia Transfer of Care Note  Patient: Lisa Kirby  Procedure(s) Performed: Procedure(s): COMPLETION THYROIDECTOMY (N/A)  Patient Location: PACU  Anesthesia Type:General  Level of Consciousness: awake, alert , oriented and patient cooperative  Airway & Oxygen Therapy: Patient Spontanous Breathing and Patient connected to face mask oxygen  Post-op Assessment: Post -op Vital signs reviewed and stable and Patient moving all extremities  Post vital signs: Reviewed and stable  Complications: No apparent anesthesia complications

## 2014-01-20 NOTE — Interval H&P Note (Signed)
History and Physical Interval Note:  01/20/2014 8:18 AM  Lisa Kirby  has presented today for surgery, with the diagnosis of papillary thyroid cancer  The various methods of treatment have been discussed with the patient and family. After consideration of risks, benefits and other options for treatment, the patient has consented to  Procedure(s): COMPLETION THYROIDECTOMY (N/A) as a surgical intervention .  The patient's history has been reviewed, patient examined, no change in status, stable for surgery.  I have reviewed the patient's chart and labs.  Questions were answered to the patient's satisfaction.    Leighton Ruff. Redmond Pulling, MD, [place], Bariatric, & Minimally Invasive Surgery [facility], [place]   [facility] M

## 2014-01-20 NOTE — H&P (View-Only) (Signed)
Patient ID: Lisa Kirby, female   DOB: January 09, 1986, 28 y.o.   MRN: [id number]  Chief Complaint  Patient presents with  . Follow-up    HPI Lisa Kirby is a 28 y.o. female.   HPI 28 year old Caucasian female comes in for her postoperative appointment after undergoing a right thyroid lobectomy for an enlarging right thyroid nodule. Her pathology report came back as follicular variant of papillary thyroid cancer, 2.2 cm, noninvasive, encapsulated. She states that she is feeling well. She denies any fever or chills. She denies any difficulty swallowing. She states her voice sounds normal to her. She reports a good appetite. She denies any diarrhea or constipation; however, she was constipated for several days after discharge from the hospital. She is here to talk about her pathology report more in detail as well as upcoming additional surgery Past Medical History  Diagnosis Date  . Right thyroid nodule     REMOVED SURGICALLY 12/29/13 - DX WITH PAPILLARY THYROID CANCER - IS SCHEDULED FOR COMPLETION THYROIDECTOMY  . Headache(784.0)     NOT MIGRAINES  . Cancer     PAPILLARY THYROID CANCER    Past Surgical History  Procedure Laterality Date  . Cesarean section    . Gallbladder surgery    . Wisdom tooth extraction    . Thyroid lobectomy Right 12/29/2013    Procedure: RIGHT THYROID LOBECTOMY;  Surgeon: Gayland Curry, MD;  Location: [facility];  Service: General;  Laterality: Right;    Family History  Problem Relation Age of Onset  . Hypertension Father   . Heart disease Father     Social History History  Substance Use Topics  . Smoking status: Current Every Day Smoker -- 0.50 packs/day for 9 years    Types: Cigarettes  . Smokeless tobacco: Never Used  . Alcohol Use: No    No Known Allergies  Current Outpatient Prescriptions  Medication Sig Dispense Refill  . etonogestrel-ethinyl estradiol (NUVARING) 0.12-0.015 MG/24HR vaginal ring Place 1 each vaginally every 28 (twenty-eight) days.  Insert vaginally and leave in place for 3 consecutive weeks, then remove for 1 week.      Marland Kitchen ibuprofen (ADVIL,MOTRIN) 800 MG tablet Take 800 mg by mouth every 8 (eight) hours as needed for mild pain or moderate pain.      Marland Kitchen oxyCODONE-acetaminophen (ROXICET) 5-325 MG per tablet Take 1-2 tablets by mouth every 6 (six) hours as needed for severe pain.  40 tablet  0   No current facility-administered medications for this visit.    Review of Systems Review of Systems  Constitutional: Negative for fever, chills and unexpected weight change.  HENT: Negative for congestion, drooling, hearing loss, sore throat, trouble swallowing and voice change.   Eyes: Negative for visual disturbance.  Respiratory: Negative for cough and wheezing.   Cardiovascular: Negative for chest pain, palpitations and leg swelling.  Gastrointestinal: Negative for nausea, vomiting, abdominal pain, diarrhea, constipation, blood in stool, abdominal distention and anal bleeding.  Genitourinary: Negative for hematuria, vaginal bleeding and difficulty urinating.  Musculoskeletal: Negative for arthralgias.  Skin: Negative for rash and wound.  Neurological: Negative for seizures, syncope and headaches.  Hematological: Negative for adenopathy. Does not bruise/bleed easily.  Psychiatric/Behavioral: Negative for confusion.    Blood pressure 130/72, pulse 70, temperature 98.6 F (37 C), temperature source Temporal, resp. rate 16, height 5\' 4"  (1.626 m), weight 189 lb 3.2 oz (85.821 kg), last menstrual period 01/09/2014.  Physical Exam Physical Exam  Vitals reviewed. Constitutional: She is oriented to person, place, and  time. She appears well-developed and well-nourished. No distress.  HENT:  Head: Normocephalic and atraumatic.  Right Ear: External ear normal.  Left Ear: External ear normal.  Eyes: Conjunctivae are normal. No scleral icterus.  Neck: Normal range of motion. Neck supple. No tracheal deviation present. No  thyromegaly present.    Transverse kocher neck incision -no cellulitis, induration or fluctuance. Scar is a little bit raised. No sign of hematoma. No lymphadenopathy.  Cardiovascular: Normal rate and normal heart sounds.   Pulmonary/Chest: Effort normal and breath sounds normal. No stridor. No respiratory distress. She has no wheezes.  Abdominal: She exhibits no distension.  Musculoskeletal: She exhibits no edema and no tenderness.  Lymphadenopathy:    She has no cervical adenopathy.  Neurological: She is alert and oriented to person, place, and time. She exhibits normal muscle tone.  Skin: Skin is warm and dry. No rash noted. She is not diaphoretic. No erythema.  Psychiatric: She has a normal mood and affect. Her behavior is normal. Judgment and thought content normal.    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 I explained that up to 50% of contralateral nodules may have cancer within them. I explained that she does not need a node dissection. She agrees with completion thyroidectomy. I explained it would be very similar to her initial surgery. We rediscussed risks and benefits including but not limited to bleeding, infection, injury to surrounding structures such as recurrent laryngeal nerve, hoarseness, hematoma, anesthesia issues, blood clot formation. We discussed the typical postoperative course. We discussed potential injury to the parathyroid glands. We discussed the need for lifelong thyroid hormone replacement. We discussed that depending on her final path report she may need additional treatment. We also discussed that she will need routine oncologic  surveillance. She is currently scheduled for completion thyroidectomy next Wednesday. All of her questions were asked and answered. I encouraged her to contact the office should she have any additional questions. I did explain that she is slightly at increased risk for infection and other complications of since this is  repeat neck surgery; however, we did not explore the contralateral side so I think it is minimal.  Leighton Ruff. Redmond Pulling, MD, FACS General, Bariatric, & Minimally Invasive Surgery [facility], [place]        [facility] M 01/14/2014, 6:48 PM

## 2014-01-20 NOTE — Anesthesia Preprocedure Evaluation (Signed)
Anesthesia Evaluation  Patient identified by MRN, date of birth, ID band Patient awake    Reviewed: Allergy & Precautions, H&P , NPO status , Patient's Chart, lab work & pertinent test results  Airway Mallampati: II TM Distance: >3 FB Neck ROM: Full    Dental no notable dental hx.    Pulmonary neg pulmonary ROS, Current Smoker,  breath sounds clear to auscultation  Pulmonary exam normal       Cardiovascular negative cardio ROS  Rhythm:Regular Rate:Normal     Neuro/Psych  Headaches, negative psych ROS   GI/Hepatic negative GI ROS, Neg liver ROS,   Endo/Other  negative endocrine ROS  Renal/GU negative Renal ROS  negative genitourinary   Musculoskeletal negative musculoskeletal ROS (+)   Abdominal   Peds negative pediatric ROS (+)  Hematology negative hematology ROS (+)   Anesthesia Other Findings   Reproductive/Obstetrics negative OB ROS                           Anesthesia Physical Anesthesia Plan  ASA: II  Anesthesia Plan: General   Post-op Pain Management:    Induction: Intravenous  Airway Management Planned: Oral ETT  Additional Equipment:   Intra-op Plan:   Post-operative Plan: Extubation in OR  Informed Consent: I have reviewed the patients History and Physical, chart, labs and discussed the procedure including the risks, benefits and alternatives for the proposed anesthesia with the patient or authorized representative who has indicated his/her understanding and acceptance.   Dental advisory given  Plan Discussed with: CRNA  Anesthesia Plan Comments:         Anesthesia Quick Evaluation

## 2014-01-20 NOTE — Op Note (Signed)
01/20/2014  10:36 AM  PATIENT:  Lisa Kirby  28 y.o. 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  POST-OPERATIVE DIAGNOSIS:  same  PROCEDURE:  Procedure(s): COMPLETION THYROIDECTOMY (subtotal thyroidectomy)  SURGEON:  Surgeon(s): Gayland Curry, MD Earnstine Regal, MD  ASSISTANTS: Ruben Im, PA-S   ANESTHESIA:   general  DRAINS: none   LOCAL MEDICATIONS USED:  NONE  SPECIMEN:  Source of Specimen:  left thyroid with isthmus, stitch superior pole  DISPOSITION OF SPECIMEN:  PATHOLOGY  COUNTS:  YES  INDICATION FOR PROCEDURE: 28 year old female who underwent recent right thyroid lobectomy for an enlarging right thyroid nodule with FNA biopsy consistent with follicular lesion of unknown significance his final pathology revealed a follicular variant of papillary thyroid cancer. Based on the size of the cancer as well as the fact that the patient had a contralateral nodule I recommended completion thyroidectomy (subtotal thyroidectomy). We rediscussed the risks and benefits of surgery including but not limited to bleeding, infection, injury to surrounding structures, recurrent laryngeal nerve injury, neck hematoma, need for lifelong thyroid hormone replacement, potential calcium supplementation, need for oncologic surveillance, potential need for additional treatment, anesthesia complications, blood clot formation, as well as the typical recovery course. We also talked about cosmetic outcomes. All the questions rests nature. She like to proceed operating room  PROCEDURE: DESCRIPTION OF PROCEDURE: After obtaining informed consent, she was  taken to the operating room #11 at [facility], placed supine on  the operating room table. General endotracheal anesthesia was  established. A gel roll was placed across her shoulder blades, and her  neck was slightly hyperextended. Her neck and upper chest were  prepped  and draped in the usual standard surgical fashion with ChloraPrep. She  received IV antibiotics prior to skin incision. A surgical time-out was  performed. I reincised her recent transverse neck incision with #15 blade.  The deep dermis was divided with electrocautery and old sutures removed.  The cautery  was taken down through the platysma. Subplatysmal flaps were lift with my finger  superiorly and inferiorly. Mahorner self-retaining retractor was  inserted. The strap muscles were identified, and the midline between  the strap muscles was re incised with Truett Mainland and old sutures removed. I then lifted  the strap muscle off the left lobe of the thyroid with electrocautery.  An army-navy retractor was then placed. I was able to then palpate the left lobe of  the thyroid. We then went about dissecting along the thyroid lobe staying on the capsule  using a combination of clips as well as Harmonic Scalpel. We started at  the superior pole and mobilized it in a sequential fashion again using  clips and Harmonic Scalpel. We identified what appeared to be the  superior parathyroid, and we were able to easily dissect it out of the  way. We were able to use blunt dissection with a Kittner as well. The  cricothyroid space was identified and dissected inferiorly. We then  took the superior pedicle with medium clips as well as Harmonic Scalpel.  We then turned our attention to the inferior and started taking down  some of the fascia and strap muscle off the capsule in a similar manner.  We were then able to start mobilizing and rotating the thyroid medially.  The middle thyroid vein had been previously ligated with clip as well as  Harmonic Scalpel. At this point, we started to identify what appeared  to be the recurrent laryngeal nerve. We used minimal energy in this  area. I ensured that the cold blade of the Harmonic Scalpel is always  on the downside. We then took down some of the posterior  attachments of  the thyroid to the trachea with electrocautery. At this point, the  left thyroid lobe had been completely mobilized. I continue to mobilize and lift off the isthmus of the trachea using Bovie electrocautery. A small pyramidal lobe was identified and also taken down with Brunilda Payor. This freed the thyroid in its entirety. A marking suture was placed on the superior pole, and it was passed off the field.  We irrigated the left neck. We had already identified the left  inferior parathyroid gland. There was no evidence of bleeding. Fibular was  placed in the right thyroid space as well as along the trachea  anteriorly. I then reapproximated the strap muscles in the midline with  inverted interrupted 3-0 Vicryl sutures. The self retaining retractor  was removed. The platysma was then reapproximated with inverted  interrupted 3-0 Vicryl and the skin was closed with a running 4-0  Monocryl in subcuticular fashion followed by the application of benzoin,  Steri-Strips, 4x4s, and Tegaderm. All needle, instrument, sponge counts  were correct x2. There were no immediate complications. The patient  tolerated the procedure well. She was taken to recovery room in stable  condition.   PLAN OF CARE: Admit for overnight observation  PATIENT DISPOSITION:  PACU - hemodynamically stable.   Delay start of Pharmacological VTE agent (>24hrs) due to surgical blood loss or risk of bleeding:  no  Leighton Ruff. Redmond Pulling, MD, FACS General, Bariatric, & Minimally Invasive Surgery [facility], [place]

## 2014-01-21 ENCOUNTER — Encounter (HOSPITAL_COMMUNITY): Payer: Self-pay | Admitting: General Surgery

## 2014-01-21 ENCOUNTER — Telehealth (INDEPENDENT_AMBULATORY_CARE_PROVIDER_SITE_OTHER): Payer: Self-pay | Admitting: General Surgery

## 2014-01-21 DIAGNOSIS — C73 Malignant neoplasm of thyroid gland: Secondary | ICD-10-CM | POA: Diagnosis not present

## 2014-01-21 LAB — BASIC METABOLIC PANEL
ANION GAP: 13 (ref 5–15)
BUN: 9 mg/dL (ref 6–23)
CALCIUM: 7.9 mg/dL — AB (ref 8.4–10.5)
CHLORIDE: 100 meq/L (ref 96–112)
CO2: 22 meq/L (ref 19–32)
CREATININE: 0.64 mg/dL (ref 0.50–1.10)
GFR calc Af Amer: >90 mL/min (ref >90)
GFR calc non Af Amer: >90 mL/min (ref >90)
Glucose, Bld: 144 mg/dL — ABNORMAL HIGH (ref 70–99)
Potassium: 3.7 mEq/L (ref 3.7–5.3)
Sodium: 135 mEq/L — ABNORMAL LOW (ref 137–147)

## 2014-01-21 MED ORDER — OXYCODONE-ACETAMINOPHEN 5-325 MG PO TABS: 1.0000 | ORAL_TABLET | ORAL | Status: DC | PRN

## 2014-01-21 MED ORDER — CALCIUM CITRATE 950 (200 CA) MG PO TABS
600.0000 mg | ORAL_TABLET | Freq: Three times a day (TID) | ORAL | Status: DC
  Administered 2014-01-21: 600 mg via ORAL
  Filled 2014-01-21 (×3): qty 3

## 2014-01-21 MED ORDER — CALCIUM CITRATE-VITAMIN D 315-250 MG-UNIT PO TABS: 2.0000 | ORAL_TABLET | Freq: Three times a day (TID) | ORAL | Status: DC

## 2014-01-21 MED ORDER — LEVOTHYROXINE SODIUM 100 MCG PO TABS: 100.0000 ug | ORAL_TABLET | Freq: Every day | ORAL | Status: DC

## 2014-01-21 NOTE — Discharge Instructions (Signed)
[facility] [phone]  THYROID & PARATHYROID SURGERY - POST OP INSTRUCTIONS  Always review your discharge instruction sheet from the facility where your surgery was performed.  A prescription for pain medication may be given to you upon discharge.  Take your pain medication as prescribed.  If narcotic pain medicine is not needed, then you may take acetaminophen (Tylenol) or ibuprofen (Advil) as needed.  Take your usually prescribed medications unless otherwise directed.  If you need a refill on your pain medication, please contact your pharmacy. They will contact our office to request authorization.  Prescriptions will not be processed after 5 pm or on weekends.  Start with a light diet upon arrival home, such as soup and crackers or toast.  Be sure to drink plenty of fluids daily.  Resume your normal diet the day after surgery.  Most patients will experience some swelling and bruising on the chest and neck area.  Ice packs will help.  Swelling and bruising can take several days to resolve.   It is common to experience some constipation if taking pain medication after surgery.  Increasing fluid intake and taking a stool softener will usually help or prevent this problem.  A mild laxative (Milk of Magnesia or Miralax) should be taken according to package directions if there are no bowel movements after 48 hours.  You may remove your bandages 48 hours after surgery, and you may shower at that time.  You have steri-strips (small skin tapes) in place directly over the incision.  These strips should be left on the skin for 7-10 days and then removed.  You may resume regular (light) daily activities beginning the next day--such as daily self-care, walking, climbing stairs--gradually increasing activities as tolerated.  You may have sexual intercourse when it is comfortable.  Refrain from any heavy lifting or straining until approved by your doctor.  You may drive when you no longer are  taking prescription pain medication, you can comfortably wear a seatbelt, and you can safely maneuver your car and apply brakes.  You should see your doctor in the office for a follow-up appointment approximately two to three weeks after your surgery.  Make sure that you call for this appointment within a day or two after you arrive home to insure a convenient appointment time.  WHEN TO CALL YOUR DOCTOR: -- Fever greater than 101.5 -- Inability to urinate -- Nausea and/or vomiting - persistent -- Extreme swelling or bruising -- Continued bleeding from incision -- Increased pain, redness, or drainage from the incision -- Difficulty swallowing or breathing -- Muscle cramping or spasms -- Numbness or tingling in hands or around lips  The clinic staff is available to answer your questions during regular business hours.  Please dont hesitate to call and ask to speak to one of the nurses if you have concerns.  What new medications will I take home?  Thyroid Hormone: If you have had thyroid surgery, you may be prescribed a type of thyroid hormone replacement called levothyroxine (Synthroid, Levothroid, Levoxyl, etc.). You should take your thyroid hormone medication on an empty stomach and by itself. Avoid taking calcium or any other medication within an hour of taking your thyroid hormone pill. A blood test will be done in 6-8 weeks to ensure the amount prescribed is correct. If you have thyroid cancer, you may be placed on liothyronine (Cytomel) instead of levothyroxine.   Calcium Supplement: Your bodys calcium level may decrease after having a total thyroidectomy or a parathyroidectomy. We  recommend you purchase your calcium supplement from a pharmacy as it is available over-the-counter and does not require a prescription; the cost is approximately $10-$15 per bottle. The calcium supplement we recommend is Citracal Maximum.   Citracal Maximum (calcium citrate 315mg  with 250 IU Vitamin D3):  You  need to take 2 tabs (one serving) to equal a dose of 630mg  calcium and 500 IU of vitamin D3.  The standard dose following surgery is 2 tabs three times daily; however, the overall dose and the number of  times during the day you should take the medication following surgery will depend on your surgeons instructions.   Leighton Ruff. Redmond Pulling, MD, [place], Bariatric, & Minimally Invasive Surgery [facility], [place]

## 2014-01-21 NOTE — Discharge Summary (Signed)
Physician Discharge Summary  Lisa Kirby YDX:[id number] DOB: 03-Nov-1985 DOA: 01/20/2014  PCP: Chalmers Guest, FNP  Admit date: 01/20/2014 Discharge date: 01/21/2014  Recommendations for Outpatient Follow-up:    Follow-up Information   Follow up with Gayland Curry, MD. Schedule an appointment as soon as possible for a visit in 2 weeks.   Specialty:  General Surgery   Contact information:   [address] [place] [address] [phone]       Follow up with Renato Shin, MD. Schedule an appointment as soon as possible for a visit in 3 weeks.   Specialty:  Endocrinology   Contact information:   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 She was kept overnight for observation. She did well. There is no signs of neck hematoma. She had no difficulty drinking liquids. She had no signs or symptoms of hypocalcemia. We discussed discharge instructions. She was deemed stable for discharge.other than the sore throat the patient had no complaints regarding her voice  BP 106/73  Pulse 70  Temp(Src) 98.6 F (37 C) (Oral)  Resp 16  Ht 5\' 4"  (1.626 m)  Wt 189 lb (85.73 kg)  BMI 32.43 kg/m2  SpO2 98%  LMP 01/10/2014  Gen: alert, NAD, non-toxic  appearing Pupils: equal, no scleral icterus Pulm: Lungs clear to auscultation, symmetric chest rise CV: regular rate and rhythm Neck: dressing clean, dry, intact; no hematoma or swelling. Ext: no edema, no calf tenderness Skin: no rash, no jaundice Voice normal    Discharge Instructions  Discharge Instructions   Discharge instructions    Complete by:  As directed   See [facility] discharge instructions     Increase activity slowly    Complete by:  As directed             Medication List         Calcium Citrate-Vitamin D 315-250 MG-UNIT Tabs  Commonly known as:  CITRACAL MAXIMUM  Take 2 tablets by mouth 3 (three) times daily.     etonogestrel-ethinyl estradiol 0.12-0.015 MG/24HR vaginal ring  Commonly known as:  [place] 1 each vaginally every 28 (twenty-eight) days. Insert vaginally and leave in place for 3 consecutive weeks, then remove for 1 week.     ibuprofen 800 MG tablet  Commonly known as:  ADVIL,MOTRIN  Take 800 mg by mouth every 8 (eight) hours as needed for mild pain or moderate pain.     levothyroxine 100 MCG tablet  Commonly known as:  SYNTHROID  Take 1 tablet (100 mcg total) by mouth daily before breakfast.     oxyCODONE-acetaminophen 5-325 MG per tablet  Commonly known as:  PERCOCET/ROXICET  Take 1-2 tablets by mouth every 4 (four) hours as needed for moderate pain.           Follow-up Information   Follow  up with Gayland Curry, MD. Schedule an appointment as soon as possible for a visit in 2 weeks.   Specialty:  General Surgery   Contact information:   [address] [place] [address] [phone]       Follow up with Renato Shin, MD. Schedule an appointment as soon as possible for a visit in 3 weeks.   Specialty:  Endocrinology   Contact information:   [address] [place] [address] [phone]        The results of significant diagnostics from this hospitalization (including imaging,  microbiology, ancillary and laboratory) are listed below for reference.    Significant Diagnostic Studies: Dg Chest 2 View  12/23/2013   CLINICAL DATA:  Preop evaluation.  EXAM: CHEST  2 VIEW  COMPARISON:  None.  FINDINGS: The heart size and mediastinal contours are within normal limits. Both lungs are clear. The visualized skeletal structures are unremarkable.  IMPRESSION: No active cardiopulmonary disease.   Electronically Signed   By: Margaree Mackintosh M.D.   On: 12/23/2013 08:46    Microbiology: No results found for this or any previous visit (from the past 240 hour(s)).   Labs: Basic Metabolic Panel:  Recent Labs Lab 01/21/14 0507  NA 135*  K 3.7  CL 100  CO2 22  GLUCOSE 144*  BUN 9  CREATININE 0.64  CALCIUM 7.9*   Liver Function Tests: No results found for this basename: AST, ALT, ALKPHOS, BILITOT, PROT, ALBUMIN,  in the last 168 hours No results found for this basename: LIPASE, AMYLASE,  in the last 168 hours No results found for this basename: AMMONIA,  in the last 168 hours CBC: No results found for this basename: WBC, NEUTROABS, HGB, HCT, MCV, PLT,  in the last 168 hours Cardiac Enzymes: No results found for this basename: CKTOTAL, CKMB, CKMBINDEX, TROPONINI,  in the last 168 hours BNP: BNP (last 3 results) No results found for this basename: PROBNP,  in the last 8760 hours CBG: No results found for this basename: GLUCAP,  in the last 168 hours  Active Problems:   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

## 2014-01-21 NOTE — Telephone Encounter (Signed)
Attempted to call pt to discuss path from L thyroid surgery but no answer. Deneise Lever - please call Monday and give path report - no cancer in L thyroid!

## 2014-01-21 NOTE — Progress Notes (Signed)
Assessment unchanged. Pt and husband verbalized understanding of dc instructions through teach back. Introduced and encouraged pt to sign up with My Chart with verbal understanding. Scripts x 2 given to pt as provided by MD. Pt understands and told nurse Dr. Redmond Pulling called in my thyroid medication. Pt understands when to follow up with surgeon and endocrinologist. Also verbalized understanding of s/s of tetany through teach back. Am dose of supplemental calcium given prior to dc. Discharged via foot per request. Accompanied by NT and husband.

## 2014-01-25 ENCOUNTER — Telehealth (INDEPENDENT_AMBULATORY_CARE_PROVIDER_SITE_OTHER): Payer: Self-pay | Admitting: General Surgery

## 2014-01-25 NOTE — Telephone Encounter (Signed)
Called patient to let her know that her path came back normal no sign of left thyroid cancer.

## 2014-01-28 NOTE — Addendum Note (Signed)
Addendum created 01/28/14 5956 by Salley Scarlet, MD   Modules edited: Anesthesia Responsible Staff

## 2014-02-11 ENCOUNTER — Ambulatory Visit (INDEPENDENT_AMBULATORY_CARE_PROVIDER_SITE_OTHER): Payer: Medicaid Other | Admitting: Endocrinology

## 2014-02-11 ENCOUNTER — Encounter: Payer: Self-pay | Admitting: Endocrinology

## 2014-02-11 DIAGNOSIS — E890 Postprocedural hypothyroidism: Secondary | ICD-10-CM | POA: Insufficient documentation

## 2014-02-11 LAB — BASIC METABOLIC PANEL
BUN: 12 mg/dL (ref 6–23)
CO2: 24 mEq/L (ref 19–32)
Calcium: 9.3 mg/dL (ref 8.4–10.5)
Chloride: 104 mEq/L (ref 96–112)
Creatinine, Ser: 0.7 mg/dL (ref 0.4–1.2)
GFR: 105.6 mL/min (ref >60.00)
GLUCOSE: 76 mg/dL (ref 70–99)
POTASSIUM: 3.7 meq/L (ref 3.5–5.1)
Sodium: 137 mEq/L (ref 135–145)

## 2014-02-11 LAB — TSH: TSH: 4.9 u[IU]/mL — ABNORMAL HIGH (ref 0.35–4.50)

## 2014-02-11 LAB — T4, FREE: Free T4: 0.92 ng/dL (ref 0.60–1.60)

## 2014-02-11 NOTE — Patient Instructions (Addendum)
blood tests are being requested for you today.  We'll contact you with results. Please stop taking the levothyroxine pill. When your thyroid is low enough, i'll request for you a radioactive iodine pill. Please come back for a follow-up appointment in 6 weeks later.

## 2014-02-11 NOTE — Progress Notes (Signed)
Subjective:    Patient ID: Lisa Kirby, female    DOB: [birth year], 28 y.o.   MRN: [id number]  HPI Pt returns for f/u of differentiated thyroid cancer (in early 2014, pt was noted on a routine visit to have a multinodular goiter.  She saw an endocrinologist in [place].  bx was advised, but did not return for f/u until she came here in early 2015.  Needle bx was suspicious; In June of 2015, she had diagnostic right lobectomy, which showed 2.2 cm FOLLICULAR VARIANT OF PAPILLARY CARCINOMA.  In July of 2015, she had completion left lobectomy--no malignancy was found).  She takes synthroid as rx'ed.  She has slight numbness near the surgical site, but no assoc pain. Past Medical History  Diagnosis Date  . Right thyroid nodule     REMOVED SURGICALLY 12/29/13 - DX WITH PAPILLARY THYROID CANCER - IS SCHEDULED FOR COMPLETION THYROIDECTOMY  . Headache(784.0)     NOT MIGRAINES  . Cancer     PAPILLARY THYROID CANCER    Past Surgical History  Procedure Laterality Date  . Cesarean section    . Gallbladder surgery    . Wisdom tooth extraction    . Thyroid lobectomy Right 12/29/2013    Procedure: RIGHT THYROID LOBECTOMY;  Surgeon: Gayland Curry, MD;  Location: [facility];  Service: General;  Laterality: Right;  . Thyroidectomy N/A 01/20/2014    Procedure: COMPLETION THYROIDECTOMY;  Surgeon: Gayland Curry, MD;  Location: [facility];  Service: General;  Laterality: N/A;    History   Social History  . Marital Status: Single    Spouse Name: N/A    Number of Children: N/A  . Years of Education: N/A   Occupational History  . Not on file.   Social History Main Topics  . Smoking status: Current Every Day Smoker -- 0.50 packs/day for 9 years    Types: Cigarettes  . Smokeless tobacco: Never Used  . Alcohol Use: No  . Drug Use: No  . Sexual Activity: Not on file   Other Topics Concern  . Not on file   Social History Narrative  . No narrative on file    Current Outpatient Prescriptions on File Prior to  Visit  Medication Sig Dispense Refill  . Calcium Citrate-Vitamin D (CITRACAL MAXIMUM) 315-250 MG-UNIT TABS Take 2 tablets by mouth 3 (three) times daily.  120 tablet    . etonogestrel-ethinyl estradiol (NUVARING) 0.12-0.015 MG/24HR vaginal ring Place 1 each vaginally every 28 (twenty-eight) days. Insert vaginally and leave in place for 3 consecutive weeks, then remove for 1 week.      Marland Kitchen ibuprofen (ADVIL,MOTRIN) 800 MG tablet Take 800 mg by mouth every 8 (eight) hours as needed for mild pain or moderate pain.       No current facility-administered medications on file prior to visit.    No Known Allergies  Family History  Problem Relation Age of Onset  . Hypertension Father   . Heart disease Father     BP 130/90  Pulse 86  Temp(Src) 98.6 F (37 C) (Oral)  Ht 5\' 4"  (1.626 m)  Wt 187 lb (84.823 kg)  BMI 32.08 kg/m2  SpO2 94%  LMP 01/09/2014  Review of Systems She has lost a few lbs.  Denies edema.      Objective:   Physical Exam VITAL SIGNS:  See vs page GENERAL: no distress Neck: a healed scar is present.  i do not appreciate a nodule in the thyroid or elsewhere in the  neck   Lab Results  Component Value Date   TSH 4.90* 02/11/2014   Lab Results  Component Value Date   CREATININE 0.7 02/11/2014   BUN 12 02/11/2014   NA 137 02/11/2014   K 3.7 02/11/2014   CL 104 02/11/2014   CO2 24 02/11/2014   Lab Results  Component Value Date   CALCIUM 9.3 02/11/2014       Assessment & Plan:  Postsurgical hypocalcemia, resolved.  I told pt this is almost always transient. Stage-1 differentiated thyroid cancer.  Due for adjuvant I-131 rx. Postsurgical hypothyroidism: as TSH is already elevated, she'll be ready for I-131 rx soon.   Patient is advised the following: Patient Instructions  blood tests are being requested for you today.  We'll contact you with results. Please stop taking the levothyroxine pill. When your thyroid is low enough, i'll request for you a radioactive  iodine pill. Please come back for a follow-up appointment in 6 weeks later.

## 2014-02-15 LAB — PTH, INTACT AND CALCIUM
Calcium: 9.5 mg/dL (ref 8.4–10.5)
PTH: 10.9 pg/mL — ABNORMAL LOW (ref 14.0–72.0)

## 2014-02-17 ENCOUNTER — Ambulatory Visit (INDEPENDENT_AMBULATORY_CARE_PROVIDER_SITE_OTHER): Payer: Medicaid Other | Admitting: General Surgery

## 2014-02-17 ENCOUNTER — Encounter (INDEPENDENT_AMBULATORY_CARE_PROVIDER_SITE_OTHER): Payer: Self-pay | Admitting: General Surgery

## 2014-02-17 ENCOUNTER — Other Ambulatory Visit: Payer: Self-pay

## 2014-02-17 ENCOUNTER — Other Ambulatory Visit (INDEPENDENT_AMBULATORY_CARE_PROVIDER_SITE_OTHER): Payer: Medicaid Other

## 2014-02-17 VITALS — BP 116/79 | HR 72 | Temp 98.3°F | Resp 16 | Ht 64.0 in | Wt 189.8 lb

## 2014-02-17 DIAGNOSIS — C73 Malignant neoplasm of thyroid gland: Secondary | ICD-10-CM

## 2014-02-17 LAB — BASIC METABOLIC PANEL
BUN: 12 mg/dL (ref 6–23)
CHLORIDE: 107 meq/L (ref 96–112)
CO2: 26 meq/L (ref 19–32)
Calcium: 8.8 mg/dL (ref 8.4–10.5)
Creatinine, Ser: 0.7 mg/dL (ref 0.4–1.2)
GFR: 100.67 mL/min (ref >60.00)
Glucose, Bld: 90 mg/dL (ref 70–99)
Potassium: 4.5 mEq/L (ref 3.5–5.1)
SODIUM: 142 meq/L (ref 135–145)

## 2014-02-17 LAB — T4, FREE: Free T4: 0.44 ng/dL — ABNORMAL LOW (ref 0.60–1.60)

## 2014-02-17 LAB — TSH: TSH: 8.69 u[IU]/mL — ABNORMAL HIGH (ref 0.35–4.50)

## 2014-02-17 NOTE — Progress Notes (Signed)
Subjective:     Patient ID: Lisa Kirby, female   DOB: August 29, 1985, 28 y.o.   MRN: [id number]  HPI 28 year old Caucasian female comes in for followup after undergoing a completion thyroidectomy on July 1. She was discharged home on July 2. She underwent a completion thyroidectomy because of a diagnosis of follicular variant of papillary thyroid cancer after a recent thyroid lobectomy. She has seen her endocrinologist last week. He has stopped her Synthroid in preparation for radioactive iodine. She denies any fever, chills, nausea or vomiting. She reports an excellent appetite. She denies any difficulty swallowing. She denies any voice changes. She denies any perioral numbness or tingling. She denies any muscle spasms. She denies any constipation.  Review of Systems     Objective:   Physical Exam BP 116/79  Pulse 72  Temp(Src) 98.3 F (36.8 C) (Oral)  Resp 16  Ht 5\' 4"  (1.626 m)  Wt 189 lb 12.8 oz (86.093 kg)  BMI 32.56 kg/m2 Alert, no apparent distress, nontoxic Neck-healed transverse Kocher incision. No cellulitis. No induration, fluctuance. No swelling or hematoma.    Assessment:     Follicular variant of papillary thyroid cancer, right lobe, T2 Status post completion thyroidectomy     Plan:     She appears to be doing well from her most recent surgery. The left thyroid lobe revealed no evidence of malignancy. She does not appear to have any overt symptoms of hypocalcemia. Dr. Loanne Drilling is managing her postoperative care with respect to her thyroid cancer. I will defer lab surveillance to him. Appreciate his assistance with this management. Followup with me in 6 months. We discussed the importance of sun avoidance on incision.  Leighton Ruff. Redmond Pulling, MD, FACS General, Bariatric, & Minimally Invasive Surgery [facility], [place]

## 2014-02-17 NOTE — Patient Instructions (Signed)
Follow up with Dr Loanne Drilling for monitoring of your thyroid cancer Keep sunscreen on incision  Follicular variant of Papillary Thyroid Cancer

## 2014-02-18 LAB — PTH, INTACT AND CALCIUM
CALCIUM: 9.1 mg/dL (ref 8.4–10.5)
PTH: 22.1 pg/mL (ref 14.0–72.0)

## 2014-02-22 ENCOUNTER — Other Ambulatory Visit: Payer: Self-pay

## 2014-02-22 ENCOUNTER — Other Ambulatory Visit (INDEPENDENT_AMBULATORY_CARE_PROVIDER_SITE_OTHER): Payer: Medicaid Other

## 2014-02-22 DIAGNOSIS — E890 Postprocedural hypothyroidism: Secondary | ICD-10-CM

## 2014-02-22 LAB — BASIC METABOLIC PANEL
BUN: 11 mg/dL (ref 6–23)
CO2: 26 mEq/L (ref 19–32)
Calcium: 8.6 mg/dL (ref 8.4–10.5)
Chloride: 103 mEq/L (ref 96–112)
Creatinine, Ser: 1 mg/dL (ref 0.4–1.2)
GFR: 73.65 mL/min (ref >60.00)
GLUCOSE: 96 mg/dL (ref 70–99)
POTASSIUM: 3.4 meq/L — AB (ref 3.5–5.1)
SODIUM: 137 meq/L (ref 135–145)

## 2014-02-22 LAB — TSH: TSH: 7.02 u[IU]/mL — AB (ref 0.35–4.50)

## 2014-02-22 LAB — T4, FREE: FREE T4: 0.3 ng/dL — AB (ref 0.60–1.60)

## 2014-02-23 LAB — PTH, INTACT AND CALCIUM
CALCIUM: 9.1 mg/dL (ref 8.4–10.5)
PTH: 22.7 pg/mL (ref 14.0–72.0)

## 2014-03-01 ENCOUNTER — Other Ambulatory Visit: Payer: Self-pay | Admitting: Endocrinology

## 2014-03-01 ENCOUNTER — Other Ambulatory Visit: Payer: Self-pay

## 2014-03-01 ENCOUNTER — Other Ambulatory Visit (INDEPENDENT_AMBULATORY_CARE_PROVIDER_SITE_OTHER): Payer: Medicaid Other

## 2014-03-01 ENCOUNTER — Telehealth: Payer: Self-pay

## 2014-03-01 DIAGNOSIS — E890 Postprocedural hypothyroidism: Secondary | ICD-10-CM

## 2014-03-01 DIAGNOSIS — C73 Malignant neoplasm of thyroid gland: Secondary | ICD-10-CM

## 2014-03-01 LAB — BASIC METABOLIC PANEL
BUN: 11 mg/dL (ref 6–23)
CO2: 23 mEq/L (ref 19–32)
Calcium: 8.6 mg/dL (ref 8.4–10.5)
Chloride: 106 mEq/L (ref 96–112)
Creatinine, Ser: 1 mg/dL (ref 0.4–1.2)
GFR: 74.53 mL/min (ref >60.00)
Glucose, Bld: 88 mg/dL (ref 70–99)
Potassium: 4.8 mEq/L (ref 3.5–5.1)
Sodium: 139 mEq/L (ref 135–145)

## 2014-03-01 LAB — T4, FREE: FREE T4: 0.2 ng/dL — AB (ref 0.60–1.60)

## 2014-03-01 LAB — TSH: TSH: 66.4 u[IU]/mL — AB (ref 0.35–4.50)

## 2014-03-01 NOTE — Telephone Encounter (Signed)
Error

## 2014-03-10 ENCOUNTER — Other Ambulatory Visit: Payer: Self-pay | Admitting: Endocrinology

## 2014-03-10 ENCOUNTER — Encounter (HOSPITAL_COMMUNITY)
Admission: RE | Admit: 2014-03-10 | Discharge: 2014-03-10 | Disposition: A | Discharge status: Home / Self Care | Payer: Medicaid Other | Source: Ambulatory Visit | Attending: Endocrinology | Admitting: Endocrinology

## 2014-03-10 ENCOUNTER — Telehealth: Payer: Self-pay

## 2014-03-10 DIAGNOSIS — C73 Malignant neoplasm of thyroid gland: Secondary | ICD-10-CM | POA: Insufficient documentation

## 2014-03-10 LAB — HCG, SERUM, QUALITATIVE: Preg, Serum: NEGATIVE

## 2014-03-10 MED ORDER — SODIUM IODIDE I 131 CAPSULE
50.2000 | Freq: Once | INTRAVENOUS | Status: AC | PRN
  Administered 2014-03-10: 50.2 via ORAL

## 2014-03-10 NOTE — Telephone Encounter (Signed)
done

## 2014-03-10 NOTE — Telephone Encounter (Signed)
Radiology called requesting that orders for post scan for radioactive iodine treatment be put in.  Thanks!

## 2014-03-15 ENCOUNTER — Other Ambulatory Visit: Payer: Medicaid Other

## 2014-03-19 ENCOUNTER — Telehealth: Payer: Self-pay

## 2014-03-19 ENCOUNTER — Encounter (HOSPITAL_COMMUNITY): Admission: RE | Admit: 2014-03-19 | Payer: Medicaid Other | Source: Ambulatory Visit

## 2014-03-19 ENCOUNTER — Encounter (HOSPITAL_COMMUNITY)
Admission: RE | Admit: 2014-03-19 | Discharge: 2014-03-19 | Disposition: A | Discharge status: Home / Self Care | Payer: Medicaid Other | Source: Ambulatory Visit | Attending: Endocrinology | Admitting: Endocrinology

## 2014-03-19 ENCOUNTER — Other Ambulatory Visit: Payer: Self-pay | Admitting: Endocrinology

## 2014-03-19 DIAGNOSIS — C73 Malignant neoplasm of thyroid gland: Secondary | ICD-10-CM | POA: Insufficient documentation

## 2014-03-19 MED ORDER — LEVOTHYROXINE SODIUM 100 MCG PO TABS: 100.0000 ug | ORAL_TABLET | Freq: Every day | ORAL | Status: DC

## 2014-03-19 NOTE — Telephone Encounter (Signed)
Error

## 2014-03-25 ENCOUNTER — Ambulatory Visit: Payer: Medicaid Other | Admitting: Endocrinology

## 2014-04-01 ENCOUNTER — Telehealth: Payer: Self-pay

## 2014-04-01 NOTE — Telephone Encounter (Signed)
Yes, it is possible that starting the levothyroxine pill can cause this, but it if this is the cause, it is temporary.

## 2014-04-01 NOTE — Telephone Encounter (Signed)
Contacted pt and advised of below. If cramps continue pt is going to follow up with her PCP.

## 2014-04-01 NOTE — Telephone Encounter (Signed)
Pt called stating that she has been having cramps in her calves only. Pt states that she has been having this issue since starting her Levothyroxine 100 mg. Pt wanted to know if the two could be be related. Please advise, Thanks!

## 2014-04-02 ENCOUNTER — Encounter (HOSPITAL_COMMUNITY): Payer: Self-pay

## 2014-04-21 ENCOUNTER — Other Ambulatory Visit: Payer: Self-pay | Admitting: Endocrinology

## 2014-04-26 ENCOUNTER — Ambulatory Visit: Payer: Medicaid Other | Admitting: Endocrinology

## 2014-05-17 ENCOUNTER — Ambulatory Visit: Payer: Medicaid Other | Admitting: Endocrinology

## 2014-05-18 ENCOUNTER — Other Ambulatory Visit: Payer: Self-pay | Admitting: Endocrinology

## 2014-06-24 ENCOUNTER — Other Ambulatory Visit: Payer: Self-pay | Admitting: Endocrinology

## 2014-07-25 ENCOUNTER — Other Ambulatory Visit: Payer: Self-pay | Admitting: Endocrinology

## 2014-08-18 ENCOUNTER — Telehealth: Payer: Self-pay | Admitting: Endocrinology

## 2014-08-18 NOTE — Telephone Encounter (Signed)
Pt has no insurance at this time but does think that she needs an adjustment in her meds she has went up to 194 lbs since sept in sept she was 186

## 2014-08-19 NOTE — Telephone Encounter (Signed)
Requested call back to discuss.  

## 2014-08-19 NOTE — Telephone Encounter (Signed)
i agree that you may need med adjustment. However, it is important to come for appt.  We'll make it just a few minutes, to minimize your cost.  We need to recheck the cancer also.

## 2014-08-19 NOTE — Telephone Encounter (Signed)
See below and please advise, Thanks!  

## 2014-08-20 NOTE — Telephone Encounter (Signed)
Pt scheduled for 08/25/2014.

## 2014-08-25 ENCOUNTER — Encounter: Payer: Self-pay | Admitting: Endocrinology

## 2014-08-25 ENCOUNTER — Ambulatory Visit (INDEPENDENT_AMBULATORY_CARE_PROVIDER_SITE_OTHER): Payer: Medicaid Other | Admitting: Endocrinology

## 2014-08-25 VITALS — BP 124/82 | HR 94 | Temp 98.2°F | Ht 64.0 in | Wt 195.0 lb

## 2014-08-25 DIAGNOSIS — C73 Malignant neoplasm of thyroid gland: Secondary | ICD-10-CM

## 2014-08-25 DIAGNOSIS — E890 Postprocedural hypothyroidism: Secondary | ICD-10-CM | POA: Diagnosis not present

## 2014-08-25 LAB — TSH: TSH: 44.51 u[IU]/mL — ABNORMAL HIGH (ref 0.35–4.50)

## 2014-08-25 MED ORDER — LEVOTHYROXINE SODIUM 150 MCG PO TABS: 150.0000 ug | ORAL_TABLET | Freq: Every day | ORAL | Status: DC

## 2014-08-25 NOTE — Progress Notes (Signed)
   Subjective:    Patient ID: Lisa Kirby, female    DOB: July 09, 1986, 29 y.o.   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 5/15.  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   7/15: completion left lobectomy--no malignancy was found 8/15: I-131, 50.2 mCi  8/15: post-therapy scan: uptake only in the thyroid bed consistent with remnant activity.  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    Postsurgical hypocalcemia: she has fatigue. She has been of Ca++ pills x 3 months.   Review of Systems Denies numbness.  She has gained weight    Objective:   Physical Exam VITAL SIGNS:  See vs page GENERAL: no distress Neck: a healed scar is present.  i do not appreciate a nodule in the thyroid or elsewhere in the neck.  Lab Results  Component Value Date   TSH 44.51* 08/25/2014       Assessment & Plan:  Postsurgical hypothyroidism: she needs increased rx Thyroid cancer: no clinical evidence of recurrence Postsurgical hypocalcemia: probably resolved.  Patient is advised the following: Patient Instructions  blood tests are being requested for you today.  We'll let you know about the results. Please come back for a follow-up appointment in 6 months.

## 2014-08-25 NOTE — Patient Instructions (Signed)
blood tests are being requested for you today.  We'll let you know about the results. Please come back for a follow-up appointment in 6 months.   

## 2014-08-26 LAB — PTH, INTACT AND CALCIUM
Calcium: 8.7 mg/dL (ref 8.4–10.5)
PTH: 30 pg/mL (ref 14–64)

## 2014-08-27 LAB — THYROGLOBULIN ANTIBODY

## 2014-08-27 LAB — THYROGLOBULIN LEVEL: Thyroglobulin: 2.2 ng/mL — ABNORMAL LOW (ref 2.8–40.9)

## 2014-11-07 IMAGING — NM NM [ID] THYROID CANCER METS SP CA TX
6 series · 6 of 6 positions shown · non-contrast
Comparison: None.

CLINICAL DATA: Patient status post remnant ablation for thyroid
cancer. T2 disease. No extracapsular extension

EXAM:
NUCLEAR MEDICINE [id number] POST THERAPY WHOLE BODY SCAN
TECHNIQUE: The patient received 50.2 mCi W-7S7 sodium iodide for the treatment
of thyroid cancer within the past 10 days. The patient returns
today, and whole body scanning was performed in the anterior and
posterior projections.

[Series 1: marker · 4.14mm/px · 1 of 1 slices shown (1 of 2)]
[im 1/1]
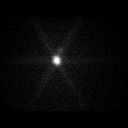

[Series 1: marker · 4.14mm/px · 1 of 1 slices shown (2 of 2)]
[im 1/1]
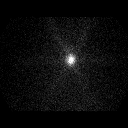

[Series 2: static thyroid no marker · 4.14mm/px · 1 of 1 slices shown (1 of 2)]
[im 1/1]
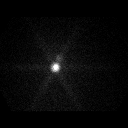

[Series 2: static thyroid no marker · 4.14mm/px · 1 of 1 slices shown (2 of 2)]
[im 1/1]
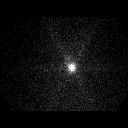

[Series 3: i131 whole body · 2.66mm/px · 1 of 1 slices shown (1 of 2)]
[im 1/1]
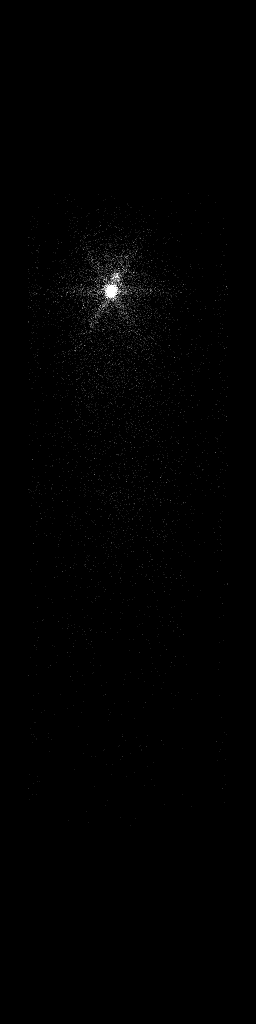

[Series 3: i131 whole body · 2.66mm/px · 1 of 1 slices shown (2 of 2)]
[im 1/1  full-range]
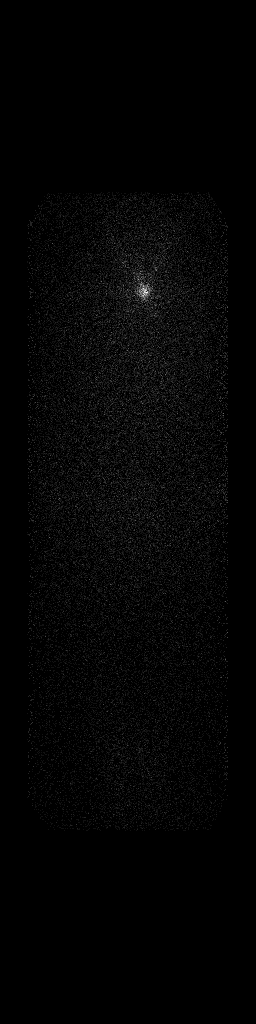

[6 of 6 positions shown; findings below may reference images not displayed]

FINDINGS: There are two foci of uptake within thyroid bed. One is intense and
central. The second is slightly superior and left central. No
abnormal uptake on the whole-body imaging.
IMPRESSION: Uptake in the thyroid bed consistent with remnant activity. Cannot
exclude a small lymph node metastasis.

No evidence of distant metastasis.

## 2014-11-18 ENCOUNTER — Telehealth: Payer: Self-pay | Admitting: Endocrinology

## 2014-11-18 DIAGNOSIS — E890 Postprocedural hypothyroidism: Secondary | ICD-10-CM

## 2014-11-18 NOTE — Telephone Encounter (Signed)
Pt would like to start a diet pill called adipex can she take this?

## 2014-11-19 NOTE — Telephone Encounter (Signed)
As we increased the thyroid medication last time, please come in for recheck.  i have ordered. If the blood test is normal, it does not pose a problem with the adipex.

## 2014-11-19 NOTE — Telephone Encounter (Signed)
See note below and please advise, Thanks! 

## 2014-11-19 NOTE — Telephone Encounter (Signed)
Patient advised of note below. Lab appointment scheduled for 11/22/2014.

## 2014-11-19 NOTE — Addendum Note (Signed)
Addended by: Renato Shin on: 11/19/2014 09:26 AM   Modules accepted: Orders

## 2014-11-22 ENCOUNTER — Other Ambulatory Visit: Payer: Medicaid Other

## 2014-12-10 ENCOUNTER — Other Ambulatory Visit: Payer: Medicaid Other

## 2015-02-28 ENCOUNTER — Ambulatory Visit (INDEPENDENT_AMBULATORY_CARE_PROVIDER_SITE_OTHER): Payer: Self-pay | Admitting: Endocrinology

## 2015-02-28 ENCOUNTER — Encounter: Payer: Self-pay | Admitting: Endocrinology

## 2015-02-28 VITALS — BP 127/86 | HR 76 | Temp 97.9°F | Ht 64.0 in | Wt 189.0 lb

## 2015-02-28 DIAGNOSIS — E890 Postprocedural hypothyroidism: Secondary | ICD-10-CM

## 2015-02-28 DIAGNOSIS — C73 Malignant neoplasm of thyroid gland: Secondary | ICD-10-CM

## 2015-02-28 LAB — TSH: TSH: 4.02 u[IU]/mL (ref 0.35–4.50)

## 2015-02-28 MED ORDER — LEVOTHYROXINE SODIUM 175 MCG PO TABS: 175.0000 ug | ORAL_TABLET | Freq: Every day | ORAL | Status: DC

## 2015-02-28 NOTE — Progress Notes (Signed)
Subjective:    Patient ID: Lisa Kirby, female    DOB: Aug 28, 1985, 29 y.o.   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 5/15.  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   7/15: completion left lobectomy--no malignancy was found 8/15: I-131, 50.2 mCi  8/15: post-therapy scan: uptake only in the thyroid bed consistent with remnant activity. 2/16: TG=2.2 (ab neg) She does not notice any nodule at the anterior neck.   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   Past Medical History  Diagnosis Date  . Right thyroid nodule     REMOVED SURGICALLY 12/29/13 - DX WITH PAPILLARY THYROID CANCER - IS SCHEDULED FOR COMPLETION THYROIDECTOMY  . Headache(784.0)     NOT MIGRAINES  . Cancer     PAPILLARY THYROID CANCER    Past Surgical History  Procedure Laterality Date  . Cesarean section    . Gallbladder surgery    . Wisdom tooth extraction    . Thyroid lobectomy Right 12/29/2013    Procedure: RIGHT THYROID LOBECTOMY;  Surgeon: Gayland Curry, MD;  Location: [facility];  Service: General;  Laterality: Right;  . Thyroidectomy N/A 01/20/2014    Procedure: COMPLETION THYROIDECTOMY;  Surgeon: Gayland Curry, MD;  Location: [facility];  Service: General;  Laterality: N/A;    History   Social History  . Marital Status: Single    Spouse Name: N/A  . Number of Children: N/A  . Years of Education: N/A   Occupational History  . Not on file.   Social History Main Topics  . Smoking status: Current Every Day Smoker -- 0.50 packs/day for 9 years    Types: Cigarettes  . Smokeless tobacco: Never Used  . Alcohol Use: No  . Drug Use: No  . Sexual Activity:  Not on file   Other Topics Concern  . Not on file   Social History Narrative    Current Outpatient Prescriptions on File Prior to Visit  Medication Sig Dispense Refill  . ibuprofen (ADVIL,MOTRIN) 800 MG tablet Take 800 mg by mouth every 8 (eight) hours as needed for mild pain or moderate pain.     No current facility-administered medications on file prior to visit.    No Known Allergies  Family History  Problem Relation Age of Onset  . Hypertension Father   . Heart disease Father     BP 127/86 mmHg  Pulse 76  Temp(Src) 97.9 F (36.6 C) (Oral)  Ht 5\' 4"  (1.626 m)  Wt 189 lb (85.73 kg)  BMI 32.43 kg/m2  SpO2 98%  Review of Systems Denies neck pain.    Objective:   Physical Exam VITAL SIGNS:  See vs page GENERAL: no distress Neck: a healed scar is present.  i do not appreciate a nodule in the thyroid or elsewhere in the neck   Lab Results  Component Value Date   TSH 4.02 02/28/2015   TG=0.2    Assessment & Plan:  Thyroid cancer, improved Postsurgical hypothyroidism: in view of cancer hx, she needs increased rx   Patient is advised the following: Patient Instructions  blood tests are being requested for you today.  We'll let  you know about the results. Please come back for a follow-up appointment in 6 months.

## 2015-02-28 NOTE — Patient Instructions (Signed)
blood tests are being requested for you today.  We'll let you know about the results. Please come back for a follow-up appointment in 6 months.   

## 2015-03-01 LAB — THYROGLOBULIN LEVEL: Thyroglobulin: 0.2 ng/mL — ABNORMAL LOW (ref 2.8–40.9)

## 2015-03-01 LAB — THYROGLOBULIN ANTIBODY

## 2015-07-26 ENCOUNTER — Ambulatory Visit (INDEPENDENT_AMBULATORY_CARE_PROVIDER_SITE_OTHER): Payer: Self-pay | Admitting: Endocrinology

## 2015-07-26 ENCOUNTER — Encounter: Payer: Self-pay | Admitting: Endocrinology

## 2015-07-26 VITALS — BP 124/86 | HR 91 | Temp 98.3°F | Ht 64.0 in | Wt 198.0 lb

## 2015-07-26 DIAGNOSIS — C73 Malignant neoplasm of thyroid gland: Secondary | ICD-10-CM

## 2015-07-26 DIAGNOSIS — E890 Postprocedural hypothyroidism: Secondary | ICD-10-CM

## 2015-07-26 LAB — TSH: TSH: 0.87 u[IU]/mL (ref 0.35–4.50)

## 2015-07-26 MED ORDER — LEVOTHYROXINE SODIUM 200 MCG PO TABS: 200.0000 ug | ORAL_TABLET | Freq: Every day | ORAL | Status: DC

## 2015-07-26 NOTE — Patient Instructions (Addendum)
blood tests are being requested for you today.  We'll let you know about the results. Please come back for a follow-up appointment in 6 months.   

## 2015-07-26 NOTE — Progress Notes (Signed)
Subjective:    Patient ID: Lisa Kirby, female    DOB: [birth year], 30 y.o.   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 5/15.  Needle bx was suspicious.  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   7/15: completion left lobectomy--no malignancy was found.  8/15: I-131, 50.2 mCi  8/15: post-therapy scan: uptake only in the thyroid bed consistent with remnant activity. 2/16: TG=2.2 (ab neg) 8/16: TG=0.2 (ab neg) She does not notice any nodule at the anterior neck.   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 pt reports weight gain.  Past Medical History  Diagnosis Date  . Right thyroid nodule     REMOVED SURGICALLY 12/29/13 - DX WITH PAPILLARY THYROID CANCER - IS SCHEDULED FOR COMPLETION THYROIDECTOMY  . Headache(784.0)     NOT MIGRAINES  . Cancer ([place])     PAPILLARY THYROID CANCER    Past Surgical History  Procedure Laterality Date  . Cesarean section    . Gallbladder surgery    . Wisdom tooth extraction    . Thyroid lobectomy Right 12/29/2013    Procedure: RIGHT THYROID LOBECTOMY;  Surgeon: Gayland Curry, MD;  Location: [facility];  Service: General;  Laterality: Right;  . Thyroidectomy N/A 01/20/2014    Procedure: COMPLETION THYROIDECTOMY;  Surgeon: Gayland Curry, MD;  Location: [facility];  Service: General;  Laterality: N/A;    Social History   Social History  . Marital Status: Single    Spouse Name: N/A  . Number of Children: N/A  . Years of Education: N/A   Occupational History  . Not on file.   Social History Main Topics  . Smoking status: Current Every Day Smoker -- 0.50 packs/day for 9 years    Types: Cigarettes  . Smokeless tobacco: Never Used  . Alcohol Use: No  . Drug Use: No  . Sexual Activity: Not on file   Other Topics Concern  . Not on file     Social History Narrative    Current Outpatient Prescriptions on File Prior to Visit  Medication Sig Dispense Refill  . cyclobenzaprine (FLEXERIL) 10 MG tablet Take 10 mg by mouth 3 (three) times daily as needed for muscle spasms.    Marland Kitchen ibuprofen (ADVIL,MOTRIN) 800 MG tablet Take 800 mg by mouth every 8 (eight) hours as needed for mild pain or moderate pain.    Marland Kitchen NUVARING 0.12-0.015 MG/24HR vaginal ring INSERT 1 RING VAGINALLY FOR 3 WEEKS THEN 1 WEEK OFF.  3   No current facility-administered medications on file prior to visit.    No Known Allergies  Family History  Problem Relation Age of Onset  . Hypertension Father   . Heart disease Father     BP 124/86 mmHg  Pulse 91  Temp(Src) 98.3 F (36.8 C) (Oral)  Ht 5\' 4"  (1.626 m)  Wt 198 lb (89.812 kg)  BMI 33.97 kg/m2  SpO2 94%  Review of Systems She has fatigue.    Objective:   Physical Exam VITAL SIGNS:  See vs page GENERAL: no distress Neck: a healed scar is present.  i do not appreciate a nodule in the thyroid or elsewhere in the neck.    Lab Results  Component Value Date   TSH 0.87 07/26/2015      Assessment & Plan:  Differentiated thyroid cancer, due for recheck.  Hypothyroidism: in this context, she should have a suppressive dosage of synthroid.    Patient is advised the following: Patient Instructions  blood tests are being requested for you today.  We'll let you know about the results.   Please come back for a follow-up appointment in 6 months.    addendum: i have sent a prescription to your pharmacy, to increase synthroid.

## 2015-07-27 ENCOUNTER — Telehealth: Payer: Self-pay | Admitting: Endocrinology

## 2015-07-27 LAB — THYROGLOBULIN ANTIBODY: Thyroglobulin Ab: <1 IU/mL (ref <2)

## 2015-07-27 LAB — THYROGLOBULIN LEVEL: THYROGLOBULIN: 0.1 ng/mL — AB (ref 2.8–40.9)

## 2015-07-27 NOTE — Telephone Encounter (Signed)
Left a voicemail advising based on the current lab work we need to increase the levothyroxine to 200 mcg.

## 2015-07-27 NOTE — Telephone Encounter (Signed)
Pt calling Megan back she will be back on the bus at 230 please try to call her before that.

## 2015-07-27 NOTE — Telephone Encounter (Signed)
Patient returning your call.

## 2015-07-27 NOTE — Telephone Encounter (Signed)
Requested a call back from the pt.  

## 2015-08-31 ENCOUNTER — Ambulatory Visit: Payer: Medicaid Other | Admitting: Endocrinology

## 2016-01-12 ENCOUNTER — Other Ambulatory Visit: Payer: Self-pay

## 2016-01-12 ENCOUNTER — Telehealth: Payer: Self-pay | Admitting: Endocrinology

## 2016-01-12 DIAGNOSIS — E890 Postprocedural hypothyroidism: Secondary | ICD-10-CM

## 2016-01-12 NOTE — Telephone Encounter (Signed)
She can come in for labs and see me at the earliest possible appointment, Megan please make sure that she has TSH, free T4 and free T3 ordered

## 2016-01-12 NOTE — Telephone Encounter (Signed)
Patient would like to come in sooner but Dr Loanne Drilling will not be back until the July the 3rd. She ask to see another doctor, Dwyane Dee).  She stated she is in a bad place right now,she is very tired with no energy,and need to be seen as soon as possible, Dr Loanne Drilling is suppose to change her medication levothyroxine when he gets back because the medication is not helping her. She can't wait until July 11th. Dr Dwyane Dee has a appt opening June 9 th. If he decided to see her.  Please advise

## 2016-01-12 NOTE — Telephone Encounter (Signed)
I contacted the pt and advised via vm Dr. Dwyane Dee would be willing to see the pt at his next available appt. Pt instructed to call back and schedule an appointment with Dr. Dwyane Dee and to schedule a lab visit. Lab orders placed within epic.

## 2016-01-30 ENCOUNTER — Encounter: Payer: Self-pay | Admitting: Endocrinology

## 2016-01-30 ENCOUNTER — Ambulatory Visit (INDEPENDENT_AMBULATORY_CARE_PROVIDER_SITE_OTHER): Payer: BC Managed Care – PPO | Admitting: Endocrinology

## 2016-01-30 VITALS — BP 120/92 | HR 100 | Wt 204.4 lb

## 2016-01-30 DIAGNOSIS — C73 Malignant neoplasm of thyroid gland: Secondary | ICD-10-CM | POA: Diagnosis not present

## 2016-01-30 DIAGNOSIS — E890 Postprocedural hypothyroidism: Secondary | ICD-10-CM | POA: Diagnosis not present

## 2016-01-30 MED ORDER — NICOTINE 21-14-7 MG/24HR TD KIT: 1.0000 | PACK | Freq: Every day | TRANSDERMAL | Status: DC

## 2016-01-30 MED ORDER — LIRAGLUTIDE 18 MG/3ML ~~LOC~~ SOPN: 1.8000 mg | PEN_INJECTOR | Freq: Every day | SUBCUTANEOUS | Status: DC

## 2016-01-30 NOTE — Patient Instructions (Addendum)
blood tests are requested for you today.  We'll let you know about the results. Out goal will be a normal thyroid level.   I have sent a prescription to your pharmacy, for the "victoza" pen, once a day.  The side-effect is nausea, which goes away with time.  To avoid this side-effect, start with the lowest (0.6) setting.  After a few days, increase to 1.2.  If you still have little or no nausea, increase to the highest (1.8) setting, and continue that setting.  Here is a discount card.  If you have any trouble using it, Vaughan Basta would be happy to show you.   Please come back for a follow-up appointment in 6 months.

## 2016-01-30 NOTE — Progress Notes (Signed)
Subjective:    Patient ID: Cade Waldroop, female    DOB: 13-Sep-1985, 30 y.o.   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 5/15.  Needle bx was suspicious.  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   7/15: completion left lobectomy--no malignancy was found.  8/15: I-131, 50.2 mCi  8/15: post-therapy scan: uptake only in the thyroid bed consistent with remnant activity.   2/16: TG=2.2 (ab neg) 8/16: TG=0.2 (ab neg) 1/17: TG=0.1 (ab neg) She does not notice any nodule at the anterior neck.   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  pt reports ongoing weight gain.  She has excessive diaphoresis throughout the body, and assoc insomnia.  Past Medical History  Diagnosis Date  . Right thyroid nodule     REMOVED SURGICALLY 12/29/13 - DX WITH PAPILLARY THYROID CANCER - IS SCHEDULED FOR COMPLETION THYROIDECTOMY  . Headache(784.0)     NOT MIGRAINES  . Cancer ([place])     PAPILLARY THYROID CANCER    Past Surgical History  Procedure Laterality Date  . Cesarean section    . Gallbladder surgery    . Wisdom tooth extraction    . Thyroid lobectomy Right 12/29/2013    Procedure: RIGHT THYROID LOBECTOMY;  Surgeon: Gayland Curry, MD;  Location: [facility];  Service: General;  Laterality: Right;  . Thyroidectomy N/A 01/20/2014    Procedure: COMPLETION THYROIDECTOMY;  Surgeon: Gayland Curry, MD;  Location: [facility];  Service: General;  Laterality: N/A;    Social History   Social History  . Marital Status: Single    Spouse Name: N/A  . Number of Children: N/A  . Years of Education: N/A   Occupational History  . Not on file.   Social History Main Topics  . Smoking status: Current Every Day Smoker -- 0.50 packs/day for 9 years    Types: Cigarettes  . Smokeless tobacco: Never Used  .  Alcohol Use: No  . Drug Use: No  . Sexual Activity: Not on file   Other Topics Concern  . Not on file   Social History Narrative    Current Outpatient Prescriptions on File Prior to Visit  Medication Sig Dispense Refill  . cyclobenzaprine (FLEXERIL) 10 MG tablet Take 10 mg by mouth 3 (three) times daily as needed for muscle spasms.    Marland Kitchen ibuprofen (ADVIL,MOTRIN) 800 MG tablet Take 800 mg by mouth every 8 (eight) hours as needed for mild pain or moderate pain.    Marland Kitchen NUVARING 0.12-0.015 MG/24HR vaginal ring INSERT 1 RING VAGINALLY FOR 3 WEEKS THEN 1 WEEK OFF.  3   No current facility-administered medications on file prior to visit.    No Known Allergies  Family History  Problem Relation Age of Onset  . Hypertension Father   . Heart disease Father     BP 120/92 mmHg  Pulse 100  Wt 204 lb 6.4 oz (92.715 kg)  SpO2 98%  LMP 01/30/2016  Review of Systems She has fatigue and hair loss.      Objective:   Physical Exam VITAL SIGNS:  See vs page GENERAL: no distress Neck: a healed scar is present.  i do not appreciate a nodule in the thyroid or elsewhere in the neck.     Lab Results  Component Value Date  TSH 0.36 01/30/2016  TG=0.1    Assessment & Plan:  Differentiated thyroid cancer: minimal TG level is stable Excessive diaphoresis, new, not thyroid-related Postsurgical hypothyroidism: due to the above sxs, our goal will be a normal TSH.  Please continue the same dosage.     Patient is advised the following: Patient Instructions  blood tests are requested for you today.  We'll let you know about the results. Out goal will be a normal thyroid level.   I have sent a prescription to your pharmacy, for the "victoza" pen, once a day.  The side-effect is nausea, which goes away with time.  To avoid this side-effect, start with the lowest (0.6) setting.  After a few days, increase to 1.2.  If you still have little or no nausea, increase to the highest (1.8) setting, and  continue that setting.  Here is a discount card.  If you have any trouble using it, Vaughan Basta would be happy to show you.   Please come back for a follow-up appointment in 6 months.     Renato Shin, MD

## 2016-01-31 LAB — THYROGLOBULIN ANTIBODY

## 2016-01-31 LAB — TSH: TSH: 0.36 u[IU]/mL (ref 0.35–4.50)

## 2016-01-31 LAB — THYROGLOBULIN LEVEL: THYROGLOBULIN: 0.1 ng/mL — AB (ref 2.8–40.9)

## 2016-01-31 MED ORDER — LEVOTHYROXINE SODIUM 200 MCG PO TABS: 200.0000 ug | ORAL_TABLET | Freq: Every day | ORAL | Status: DC

## 2016-05-28 ENCOUNTER — Telehealth: Payer: Self-pay | Admitting: Endocrinology

## 2016-05-28 NOTE — Telephone Encounter (Signed)
Ok if OK with PCP.  Ret here PRN

## 2016-05-28 NOTE — Telephone Encounter (Signed)
I contacted the patient and advised of message. Patient verbalized understanding.

## 2016-05-28 NOTE — Telephone Encounter (Signed)
See message and please advise, Thanks!  

## 2016-05-28 NOTE — Telephone Encounter (Signed)
Pt called in and said that she is having really bad leg cramps, she wanted to know if she could have her levels checked at her family doctor instead of here because it is more cost efficient.  Please advise.

## 2016-06-26 ENCOUNTER — Telehealth: Payer: Self-pay | Admitting: Endocrinology

## 2016-06-26 NOTE — Telephone Encounter (Signed)
Patient ask you to  Give her a call

## 2016-06-27 NOTE — Telephone Encounter (Signed)
I contacted the patient and she advised me her OBGYN had drawn her thyroid panel and wanted to know if Dr. Loanne Drilling would like to review. I advised the patient to send the blood test results for Dr. Loanne Drilling to review. She voiced understanding.

## 2016-07-29 NOTE — Progress Notes (Signed)
Subjective:    Patient ID: Lisa Kirby, female    DOB: Feb 02, 1986, 31 y.o.   MRN: [id number]  HPI Pt returns for f/u of stage 1 differentiated thyroid cancer: early 2014: pt was noted on a routine visit in [place] to have a multinodular goiter: bx was advised, but pt did not return for f/u. 5/15.  Needle bx was suspicious.  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   7/15: completion left lobectomy--no malignancy was found.  8/15: I-131, 50.2 mCi  8/15: post-therapy scan: uptake only in the thyroid bed consistent with remnant activity.   2/16: TG=2.2 (ab neg) 8/16: TG=0.2 (ab neg) 1/17: TG=0.1 (ab neg) 7/17: TG=0.1 (ab neg) She does not notice any nodule at the anterior neck.   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  No weight change.   Past Medical History:  Diagnosis Date  . Cancer ([facility])    PAPILLARY THYROID CANCER  . Headache(784.0)    NOT MIGRAINES  . Right thyroid nodule    REMOVED SURGICALLY 12/29/13 - DX WITH PAPILLARY THYROID CANCER - IS SCHEDULED FOR COMPLETION THYROIDECTOMY    Past Surgical History:  Procedure Laterality Date  . CESAREAN SECTION    . GALLBLADDER SURGERY    . THYROID LOBECTOMY Right 12/29/2013   Procedure: RIGHT THYROID LOBECTOMY;  Surgeon: Gayland Curry, MD;  Location: [facility];  Service: General;  Laterality: Right;  . THYROIDECTOMY N/A 01/20/2014   Procedure: COMPLETION THYROIDECTOMY;  Surgeon: Gayland Curry, MD;  Location: [facility];  Service: General;  Laterality: N/A;  . WISDOM TOOTH EXTRACTION      Social History   Social History  . Marital status: Single    Spouse name: N/A  . Number of children: N/A  . Years of education: N/A   Occupational History  . Not on file.   Social History Main Topics  . Smoking status: Current Every Day Smoker    Packs/day: 0.50    Years: 9.00    Types: Cigarettes  . Smokeless tobacco: Never Used  . Alcohol use No  . Drug use: No  . Sexual activity:  Not on file   Other Topics Concern  . Not on file   Social History Narrative  . No narrative on file    Current Outpatient Prescriptions on File Prior to Visit  Medication Sig Dispense Refill  . cyclobenzaprine (FLEXERIL) 10 MG tablet Take 10 mg by mouth 3 (three) times daily as needed for muscle spasms.    Marland Kitchen ibuprofen (ADVIL,MOTRIN) 800 MG tablet Take 800 mg by mouth every 8 (eight) hours as needed for mild pain or moderate pain.    Marland Kitchen levothyroxine (SYNTHROID, LEVOTHROID) 200 MCG tablet Take 1 tablet (200 mcg total) by mouth daily before breakfast. 90 tablet 2  . Liraglutide (VICTOZA) 18 MG/3ML SOPN Inject 0.3 mLs (1.8 mg total) into the skin daily. And pen needles 1/day 6 mL 11  . Nicotine 21-14-7 MG/24HR KIT Place 1 patch onto the skin daily. 56 each 3  . NUVARING 0.12-0.015 MG/24HR vaginal ring INSERT 1 RING VAGINALLY FOR 3 WEEKS THEN 1 WEEK OFF.  3   No current facility-administered medications on file prior to visit.     No Known Allergies  Family History  Problem Relation Age of Onset  . Hypertension Father   . Heart disease Father    BP 134/80   Pulse 100   Ht '5\' 4"'  (1.626 m)   Wt 181 lb (82.1 kg)  SpO2 97%   BMI 31.07 kg/m   Review of Systems Denies neck pain, but she has hair loss.     Objective:   Physical Exam VITAL SIGNS:  See vs page.  GENERAL: no distress. Neck: a healed scar is present.  i do not appreciate a nodule in the thyroid or elsewhere in the neck.    TG is undetectable Lab Results  Component Value Date   TSH 3.32 08/01/2016      Assessment & Plan:  Differentiated thyroid cancer: no clinical or lab evidence of recurrence.  We'll follow. Excessive diaphoresis, new.  This has limited synthroid dosage. Postsurgical hypothyroidism: due to the above sxs, our goal will be a normal TSH.  Please continue the same dosage.

## 2016-08-01 ENCOUNTER — Encounter: Payer: Self-pay | Admitting: Endocrinology

## 2016-08-01 ENCOUNTER — Ambulatory Visit (INDEPENDENT_AMBULATORY_CARE_PROVIDER_SITE_OTHER): Payer: BC Managed Care – PPO | Admitting: Endocrinology

## 2016-08-01 VITALS — BP 134/80 | HR 100 | Ht 64.0 in | Wt 181.0 lb

## 2016-08-01 DIAGNOSIS — C73 Malignant neoplasm of thyroid gland: Secondary | ICD-10-CM | POA: Diagnosis not present

## 2016-08-01 DIAGNOSIS — E890 Postprocedural hypothyroidism: Secondary | ICD-10-CM | POA: Diagnosis not present

## 2016-08-01 LAB — TSH: TSH: 3.32 u[IU]/mL (ref 0.35–4.50)

## 2016-08-01 NOTE — Patient Instructions (Addendum)
blood tests are requested for you today.  We'll let you know about the results. Out goal will be a normal thyroid level.    Please come back for a follow-up appointment in 6 months.    

## 2016-08-02 LAB — THYROGLOBULIN ANTIBODY: Thyroglobulin Ab: <1 IU/mL (ref <2)

## 2016-08-02 LAB — THYROGLOBULIN LEVEL

## 2016-08-03 ENCOUNTER — Telehealth: Payer: Self-pay | Admitting: Endocrinology

## 2016-08-03 NOTE — Telephone Encounter (Signed)
I contacted the patient and advised of message. She voiced understanding.  

## 2016-08-03 NOTE — Telephone Encounter (Signed)
please call patient: I need to clarify the cancer blood test result.  The past few times, it has been at a low level.  Now it is completely negative--really good.

## 2016-09-19 ENCOUNTER — Telehealth: Payer: Self-pay | Admitting: Endocrinology

## 2016-09-19 NOTE — Telephone Encounter (Signed)
I contacted the patient and advised of message. Patient voiced understanding.  

## 2016-09-19 NOTE — Telephone Encounter (Signed)
Ok, please try reducing to 1.2 mg qd for a week or so, then try to re-increase.

## 2016-09-19 NOTE — Telephone Encounter (Signed)
See message and please advise, Thanks!  

## 2016-09-19 NOTE — Telephone Encounter (Signed)
Having stomach problems think Victoza could be the problem.  Please avise

## 2016-10-15 ENCOUNTER — Telehealth: Payer: Self-pay | Admitting: Endocrinology

## 2016-10-15 MED ORDER — INSULIN PEN NEEDLE 31G X 5 MM MISC: 2 refills | Status: DC

## 2016-10-15 NOTE — Telephone Encounter (Signed)
Pt called in and requested the smallest sized gauge needles for her Victoza be sent into the [facility] in [place].

## 2016-10-16 ENCOUNTER — Telehealth: Payer: Self-pay | Admitting: Endocrinology

## 2016-10-16 NOTE — Telephone Encounter (Signed)
Patient notified of response and voiced understanding.

## 2016-10-16 NOTE — Telephone Encounter (Signed)
Unlikely to be a problem, so you could try it.

## 2016-10-16 NOTE — Telephone Encounter (Signed)
Pt called in and said that her doctor has put her on two different antibiotics, along with chewable pepto bismol for H Pyloric, she said that she read that her Levothyroxine should be taken with antiacids within a certain amount of time of each other.  She wants to know if it is okay to take these together.

## 2016-10-16 NOTE — Telephone Encounter (Signed)
See message and please advise, Thanks!  

## 2016-10-20 ENCOUNTER — Other Ambulatory Visit: Payer: Self-pay | Admitting: Endocrinology

## 2016-10-22 ENCOUNTER — Other Ambulatory Visit: Payer: Self-pay | Admitting: Endocrinology

## 2016-10-22 NOTE — Telephone Encounter (Signed)
Refill submitted on 10/21/2016.

## 2016-10-22 NOTE — Telephone Encounter (Signed)
[facility] for [facility]ine

## 2016-10-31 ENCOUNTER — Other Ambulatory Visit: Payer: Self-pay | Admitting: Endocrinology

## 2016-11-01 MED ORDER — LEVOTHYROXINE SODIUM 200 MCG PO TABS: 200.0000 ug | ORAL_TABLET | Freq: Every day | ORAL | 2 refills | Status: DC

## 2017-01-29 ENCOUNTER — Ambulatory Visit: Payer: BC Managed Care – PPO | Admitting: Endocrinology

## 2017-02-06 ENCOUNTER — Ambulatory Visit (INDEPENDENT_AMBULATORY_CARE_PROVIDER_SITE_OTHER): Payer: BC Managed Care – PPO | Admitting: Endocrinology

## 2017-02-06 ENCOUNTER — Encounter: Payer: Self-pay | Admitting: Endocrinology

## 2017-02-06 VITALS — Ht 64.0 in | Wt 176.0 lb

## 2017-02-06 DIAGNOSIS — R5383 Other fatigue: Secondary | ICD-10-CM | POA: Diagnosis not present

## 2017-02-06 DIAGNOSIS — E890 Postprocedural hypothyroidism: Secondary | ICD-10-CM | POA: Diagnosis not present

## 2017-02-06 DIAGNOSIS — C73 Malignant neoplasm of thyroid gland: Secondary | ICD-10-CM | POA: Diagnosis not present

## 2017-02-06 LAB — BASIC METABOLIC PANEL
BUN: 12 mg/dL (ref 6–23)
CO2: 23 mEq/L (ref 19–32)
CREATININE: 0.69 mg/dL (ref 0.40–1.20)
Calcium: 8.6 mg/dL (ref 8.4–10.5)
Chloride: 107 mEq/L (ref 96–112)
GFR: 106.78 mL/min (ref >60.00)
GLUCOSE: 92 mg/dL (ref 70–99)
Potassium: 3.7 mEq/L (ref 3.5–5.1)
Sodium: 138 mEq/L (ref 135–145)

## 2017-02-06 LAB — TSH: TSH: 0.01 u[IU]/mL — AB (ref 0.35–4.50)

## 2017-02-06 LAB — VITAMIN D 25 HYDROXY (VIT D DEFICIENCY, FRACTURES): VITD: 42.39 ng/mL (ref 30.00–100.00)

## 2017-02-06 MED ORDER — LEVOTHYROXINE SODIUM 175 MCG PO TABS: 175.0000 ug | ORAL_TABLET | Freq: Every day | ORAL | 3 refills | Status: DC

## 2017-02-06 NOTE — Patient Instructions (Addendum)
blood tests are requested for you today.  We'll let you know about the results. Out goal will be a normal thyroid level.    Please come back for a follow-up appointment in 6 months.    

## 2017-02-06 NOTE — Progress Notes (Signed)
Subjective:    Patient ID: Lisa Kirby, female    DOB: 1986-04-04, 31 y.o.   MRN: [id number]  HPI Pt returns for f/u of stage 1 differentiated thyroid cancer: early 2014: pt was noted on a routine visit in [place] to have a multinodular goiter: bx was advised, but pt did not return for f/u. 5/15.  Needle bx was suspicious.  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   7/15: completion left lobectomy--no malignancy was found.  8/15: I-131, 50.2 mCi  8/15: post-therapy scan: uptake only in the thyroid bed consistent with remnant activity.   2/16: TG=2.2 (ab neg) 8/16: TG=0.2 (ab neg) 1/17: TG=0.1 (ab neg) 7/17: TG=0.1 (ab neg) 1/18: TG is underectable (ab neg) She does not notice any nodule at the anterior neck.   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  She has slight constipation.   She feels the victoza is helping, but weight has leveled off.   Past Medical History:  Diagnosis Date  . Cancer (HCC)    PAPILLARY THYROID CANCER  . Headache(784.0)    NOT MIGRAINES  . Right thyroid nodule    REMOVED SURGICALLY 12/29/13 - DX WITH PAPILLARY THYROID CANCER - IS SCHEDULED FOR COMPLETION THYROIDECTOMY    Past Surgical History:  Procedure Laterality Date  . CESAREAN SECTION    . GALLBLADDER SURGERY    . THYROID LOBECTOMY Right 12/29/2013   Procedure: RIGHT THYROID LOBECTOMY;  Surgeon: Gayland Curry, MD;  Location: [facility];  Service: General;  Laterality: Right;  . THYROIDECTOMY N/A 01/20/2014   Procedure: COMPLETION THYROIDECTOMY;  Surgeon: Gayland Curry, MD;  Location: [facility];  Service: General;  Laterality: N/A;  . WISDOM TOOTH EXTRACTION      Social History   Social History  . Marital status: Single    Spouse name: N/A  . Number of children: N/A  . Years of education: N/A   Occupational History  . Not on file.   Social History Main Topics  . Smoking status: Current Every Day Smoker    Packs/day: 0.50    Years: 9.00      Types: Cigarettes  . Smokeless tobacco: Never Used  . Alcohol use No  . Drug use: No  . Sexual activity: Not on file   Other Topics Concern  . Not on file   Social History Narrative  . No narrative on file    Current Outpatient Prescriptions on File Prior to Visit  Medication Sig Dispense Refill  . cyclobenzaprine (FLEXERIL) 10 MG tablet Take 10 mg by mouth 3 (three) times daily as needed for muscle spasms.    Marland Kitchen HYDROcodone-acetaminophen (NORCO/VICODIN) 5-325 MG tablet Take 1 tablet by mouth every 6 (six) hours as needed for moderate pain.    Marland Kitchen ibuprofen (ADVIL,MOTRIN) 800 MG tablet Take 800 mg by mouth every 8 (eight) hours as needed for mild pain or moderate pain.    . Insulin Pen Needle 31G X 5 MM MISC Use to inject medication 1 time per day. 100 each 2  . Nicotine 21-14-7 MG/24HR KIT Place 1 patch onto the skin daily. 56 each 3  . NUVARING 0.12-0.015 MG/24HR vaginal ring INSERT 1 RING VAGINALLY FOR 3 WEEKS THEN 1 WEEK OFF.  3  . VICTOZA 18 MG/3ML SOPN INJECT 0.3 MLS (1.8 MG TOTAL) INTO THE SKIN DAILY. AND PEN NEEDLES 1/DAY 6 pen 5   No current facility-administered medications on file prior to visit.     No Known Allergies  Family History  Problem Relation Age of Onset  . Hypertension Father   . Heart disease Father     Ht '5\' 4"'  (1.626 m)   Wt 176 lb (79.8 kg)   BMI 30.21 kg/m   Review of Systems N/v resolved with rx of H pylori.  Denies neck pain.      Objective:   Physical Exam VITAL SIGNS:  See vs page GENERAL: no distress Neck: a healed scar is present.  I do not appreciate a nodule in the thyroid or elsewhere in the neck.    TG is undetectable  Lab Results  Component Value Date   TSH 0.01 (L) 02/06/2017      Assessment & Plan:  Differentiated thyroid cancer: no evidence of recurrence.  We'll follow. Obesity, improved.  Please continue the same victoza Postsurgical hypothyroidism: overreplaced.  I have sent a prescription to your pharmacy, to  reduce synthroid.

## 2017-02-07 ENCOUNTER — Telehealth: Payer: Self-pay | Admitting: Endocrinology

## 2017-02-07 LAB — THYROGLOBULIN ANTIBODY: Thyroglobulin Ab: <1 IU/mL (ref <2)

## 2017-02-07 LAB — THYROGLOBULIN LEVEL

## 2017-02-07 NOTE — Telephone Encounter (Signed)
Please call back RE labs. Call work phone.  Thank you,  -LL

## 2017-02-07 NOTE — Telephone Encounter (Signed)
Called patient back and informed her of results.  

## 2017-02-08 ENCOUNTER — Ambulatory Visit: Payer: BC Managed Care – PPO | Admitting: Endocrinology

## 2017-03-05 ENCOUNTER — Telehealth: Payer: Self-pay | Admitting: Endocrinology

## 2017-03-05 NOTE — Telephone Encounter (Signed)
Patient calling over concerns about medication levothyroxine (SYNTHROID, LEVOTHROID) 175 MCG tablet [[id number]]  States she saw a recall for medication on TV and would like to speak to clinical staff about it. Asking for return phone call as soon as possible.

## 2017-03-05 NOTE — Telephone Encounter (Signed)
See message and please advise, Thanks!  

## 2017-03-05 NOTE — Telephone Encounter (Signed)
Routing to you °

## 2017-03-05 NOTE — Telephone Encounter (Signed)
Please call your pharmacy, to see if it is recalled.  We would be happy to send to a different pharmacy if you wish.

## 2017-03-06 NOTE — Telephone Encounter (Signed)
Called patient and she is contacting pharmacy then will call us back.

## 2017-04-11 ENCOUNTER — Telehealth: Payer: Self-pay | Admitting: Endocrinology

## 2017-04-11 NOTE — Telephone Encounter (Signed)
I spoke to patient & she concerned that Victoza even though covered by insurance is now $90. She was wondering if there was another alternative that she could try.

## 2017-04-11 NOTE — Telephone Encounter (Signed)
Patient called in reference to having questions about her Rx for [place] 18 MG/3ML SOPN. Please call patient and advise.

## 2017-04-11 NOTE — Telephone Encounter (Signed)
Options: Discount card caps copay at $25 (we don't have now, but you can print from the internet).   See if an alternative is preferred on formulary.

## 2017-04-12 NOTE — Telephone Encounter (Signed)
Notified patient and she stated that she would look online for discount card.

## 2017-07-20 ENCOUNTER — Other Ambulatory Visit: Payer: Self-pay | Admitting: Endocrinology

## 2017-08-09 ENCOUNTER — Ambulatory Visit (INDEPENDENT_AMBULATORY_CARE_PROVIDER_SITE_OTHER): Payer: BC Managed Care – PPO | Admitting: Endocrinology

## 2017-08-09 ENCOUNTER — Encounter: Payer: Self-pay | Admitting: Endocrinology

## 2017-08-09 ENCOUNTER — Other Ambulatory Visit (INDEPENDENT_AMBULATORY_CARE_PROVIDER_SITE_OTHER): Payer: BC Managed Care – PPO

## 2017-08-09 VITALS — BP 124/86 | HR 101 | Wt 180.6 lb

## 2017-08-09 DIAGNOSIS — C73 Malignant neoplasm of thyroid gland: Secondary | ICD-10-CM

## 2017-08-09 DIAGNOSIS — E890 Postprocedural hypothyroidism: Secondary | ICD-10-CM | POA: Diagnosis not present

## 2017-08-09 LAB — TSH: TSH: 0.4 u[IU]/mL (ref 0.35–4.50)

## 2017-08-09 LAB — T4, FREE: Free T4: 1.07 ng/dL (ref 0.60–1.60)

## 2017-08-09 NOTE — Progress Notes (Signed)
Subjective:    Patient ID: Lisa Kirby, female    DOB: Nov 29, 1985, 32 y.o.   MRN: [id number]  HPI Pt returns for f/u of stage 1 differentiated thyroid cancer: 2014: pt was noted on a routine visit in [place] to have a multinodular goiter: bx was advised, but pt did not return for f/u. 5/15.  Needle bx was suspicious.  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   7/15: completion left lobectomy--no malignancy was found.  8/15: I-131, 50.2 mCi  8/15: post-therapy scan: uptake only in the thyroid bed consistent with remnant activity.   2/16: TG=2.2 (ab neg) 8/16: TG=0.2 (ab neg) 1/17: TG=0.1 (ab neg) 7/17: TG=0.1 (ab neg) 1/18: TG is undetectable (ab neg) She does not notice any nodule at the anterior neck.   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  She has fatigue She has gained 2 lbs, due to prednisone.   Past Medical History:  Diagnosis Date  . Cancer (HCC)    PAPILLARY THYROID CANCER  . Headache(784.0)    NOT MIGRAINES  . Right thyroid nodule    REMOVED SURGICALLY 12/29/13 - DX WITH PAPILLARY THYROID CANCER - IS SCHEDULED FOR COMPLETION THYROIDECTOMY    Past Surgical History:  Procedure Laterality Date  . CESAREAN SECTION    . GALLBLADDER SURGERY    . THYROID LOBECTOMY Right 12/29/2013   Procedure: RIGHT THYROID LOBECTOMY;  Surgeon: Gayland Curry, MD;  Location: [facility];  Service: General;  Laterality: Right;  . THYROIDECTOMY N/A 01/20/2014   Procedure: COMPLETION THYROIDECTOMY;  Surgeon: Gayland Curry, MD;  Location: [facility];  Service: General;  Laterality: N/A;  . WISDOM TOOTH EXTRACTION      Social History   Socioeconomic History  . Marital status: Single    Spouse name: Not on file  . Number of children: Not on file  . Years of education: Not on file  . Highest education level: Not on file  Social Needs  . Financial resource strain: Not on file  . Food insecurity - worry: Not on file  . Food insecurity -  inability: Not on file  . Transportation needs - medical: Not on file  . Transportation needs - non-medical: Not on file  Occupational History  . Not on file  Tobacco Use  . Smoking status: Current Every Day Smoker    Packs/day: 0.50    Years: 9.00    Pack years: 4.50    Types: Cigarettes  . Smokeless tobacco: Never Used  Substance and Sexual Activity  . Alcohol use: No  . Drug use: No  . Sexual activity: Not on file  Other Topics Concern  . Not on file  Social History Narrative  . Not on file    Current Outpatient Medications on File Prior to Visit  Medication Sig Dispense Refill  . cyclobenzaprine (FLEXERIL) 10 MG tablet Take 10 mg by mouth 3 (three) times daily as needed for muscle spasms.    Marland Kitchen HYDROcodone-acetaminophen (NORCO/VICODIN) 5-325 MG tablet Take 1 tablet by mouth every 6 (six) hours as needed for moderate pain.    Marland Kitchen ibuprofen (ADVIL,MOTRIN) 800 MG tablet Take 800 mg by mouth every 8 (eight) hours as needed for mild pain or moderate pain.    . Insulin Pen Needle (B-D UF III MINI PEN NEEDLES) 31G X 5 MM MISC USE TO INJECT MEDICATION 1 TIME PER DAY. 90 each 2  . levothyroxine (SYNTHROID, LEVOTHROID) 175 MCG tablet Take 1 tablet (175 mcg total)  by mouth daily before breakfast. 90 tablet 3  . Nicotine 21-14-7 MG/24HR KIT Place 1 patch onto the skin daily. 56 each 3  . NUVARING 0.12-0.015 MG/24HR vaginal ring INSERT 1 RING VAGINALLY FOR 3 WEEKS THEN 1 WEEK OFF.  3  . VICTOZA 18 MG/3ML SOPN INJECT 0.3 MLS (1.8 MG TOTAL) INTO THE SKIN DAILY. AND PEN NEEDLES 1/DAY 6 pen 5   No current facility-administered medications on file prior to visit.     No Known Allergies  Family History  Problem Relation Age of Onset  . Hypertension Father   . Heart disease Father     BP 124/86 (BP Location: Left Arm, Patient Position: Sitting, Cuff Size: Normal)   Pulse (!) 101   Wt 180 lb 9.6 oz (81.9 kg)   SpO2 97%   BMI 31.00 kg/m    Review of Systems Denies leg edema.  She  denies tremor.       Objective:   Physical Exam VITAL SIGNS:  See vs page GENERAL: no distress Neck: a healed scar is present.  I do not appreciate a nodule in the thyroid or elsewhere in the neck.  Lab Results  Component Value Date   TSH 0.40 08/09/2017      Assessment & Plan:  Postsurgical hypothyroidism: well-replaced.  Differentiated thyroid cancer. recheck today.   Patient Instructions  blood tests are requested for you today.  We'll let you know about the results. Out goal will be a normal thyroid level.    Please come back for a follow-up appointment in 6 months.

## 2017-08-09 NOTE — Patient Instructions (Signed)
blood tests are requested for you today.  We'll let you know about the results. Out goal will be a normal thyroid level.    Please come back for a follow-up appointment in 6 months.

## 2017-08-12 ENCOUNTER — Telehealth: Payer: Self-pay | Admitting: Endocrinology

## 2017-08-12 LAB — THYROGLOBULIN LEVEL: THYROGLOBULIN: 0.1 ng/mL — AB

## 2017-08-12 LAB — THYROGLOBULIN ANTIBODY

## 2017-08-12 NOTE — Telephone Encounter (Signed)
Patient needs to know if her thyroid medication is being changed-had labs done Friday 08/09/17. She does not want to pick up her thyroid medication that is ready until she knows if it is changing. She was told she would get a call last Friday afternoon. Please call patient asap (she is waiting to take her thyroid med until she hears from you) at ph# [phone] to advise

## 2017-08-12 NOTE — Telephone Encounter (Signed)
I called patient to notify her no changes were needed.

## 2017-11-03 ENCOUNTER — Other Ambulatory Visit: Payer: Self-pay | Admitting: Endocrinology

## 2018-01-22 ENCOUNTER — Other Ambulatory Visit: Payer: Self-pay | Admitting: Endocrinology

## 2018-02-05 ENCOUNTER — Ambulatory Visit (INDEPENDENT_AMBULATORY_CARE_PROVIDER_SITE_OTHER): Payer: BC Managed Care – PPO | Admitting: Endocrinology

## 2018-02-05 ENCOUNTER — Encounter: Payer: Self-pay | Admitting: Endocrinology

## 2018-02-05 VITALS — BP 120/88 | HR 83 | Ht 64.0 in | Wt 177.0 lb

## 2018-02-05 DIAGNOSIS — C73 Malignant neoplasm of thyroid gland: Secondary | ICD-10-CM | POA: Diagnosis not present

## 2018-02-05 DIAGNOSIS — E890 Postprocedural hypothyroidism: Secondary | ICD-10-CM | POA: Diagnosis not present

## 2018-02-05 LAB — TSH: TSH: 0.03 u[IU]/mL — ABNORMAL LOW (ref 0.35–4.50)

## 2018-02-05 MED ORDER — LEVOTHYROXINE SODIUM 150 MCG PO TABS: 150.0000 ug | ORAL_TABLET | Freq: Every day | ORAL | 3 refills | Status: DC

## 2018-02-05 NOTE — Progress Notes (Signed)
Subjective:    Patient ID: Lisa Kirby, female    DOB: 1986/06/30, 32 y.o.   MRN: [id number]  HPI Pt returns for f/u of stage 1 differentiated thyroid cancer: 2014: pt was noted on a routine visit in [place] to have a multinodular goiter: bx was advised, but pt did not return for f/u. 5/15.  Needle bx was suspicious.  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   7/15: completion left lobectomy--no malignancy was found.  8/15: I-131, 50.2 mCi  8/15: post-therapy scan: uptake only in the thyroid bed consistent with remnant activity.   2/16: TG=2.2 (ab neg) 8/16: TG=0.2 (ab neg) 1/17: TG=0.1 (ab neg) 7/17: TG=0.1 (ab neg) 1/18: TG is undetectable (ab neg) She does not notice any nodule at the anterior neck.   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  She has ongoing fatigue.   Past Medical History:  Diagnosis Date  . Cancer (HCC)    PAPILLARY THYROID CANCER  . Headache(784.0)    NOT MIGRAINES  . Right thyroid nodule    REMOVED SURGICALLY 12/29/13 - DX WITH PAPILLARY THYROID CANCER - IS SCHEDULED FOR COMPLETION THYROIDECTOMY    Past Surgical History:  Procedure Laterality Date  . CESAREAN SECTION    . GALLBLADDER SURGERY    . THYROID LOBECTOMY Right 12/29/2013   Procedure: RIGHT THYROID LOBECTOMY;  Surgeon: Gayland Curry, MD;  Location: [facility];  Service: General;  Laterality: Right;  . THYROIDECTOMY N/A 01/20/2014   Procedure: COMPLETION THYROIDECTOMY;  Surgeon: Gayland Curry, MD;  Location: [facility];  Service: General;  Laterality: N/A;  . WISDOM TOOTH EXTRACTION      Social History   Socioeconomic History  . Marital status: Single    Spouse name: Not on file  . Number of children: Not on file  . Years of education: Not on file  . Highest education level: Not on file  Occupational History  . Not on file  Social Needs  . Financial resource strain: Not on file  . Food insecurity:    Worry: Not on file    Inability:  Not on file  . Transportation needs:    Medical: Not on file    Non-medical: Not on file  Tobacco Use  . Smoking status: Current Every Day Smoker    Packs/day: 0.50    Years: 9.00    Pack years: 4.50    Types: Cigarettes  . Smokeless tobacco: Never Used  Substance and Sexual Activity  . Alcohol use: No  . Drug use: No  . Sexual activity: Not on file  Lifestyle  . Physical activity:    Days per week: Not on file    Minutes per session: Not on file  . Stress: Not on file  Relationships  . Social connections:    Talks on phone: Not on file    Gets together: Not on file    Attends religious service: Not on file    Active member of club or organization: Not on file    Attends meetings of clubs or organizations: Not on file    Relationship status: Not on file  . Intimate partner violence:    Fear of current or ex partner: Not on file    Emotionally abused: Not on file    Physically abused: Not on file    Forced sexual activity: Not on file  Other Topics Concern  . Not on file  Social History Narrative  . Not on file  Current Outpatient Medications on File Prior to Visit  Medication Sig Dispense Refill  . cyclobenzaprine (FLEXERIL) 10 MG tablet Take 10 mg by mouth 3 (three) times daily as needed for muscle spasms.    Marland Kitchen HYDROcodone-acetaminophen (NORCO/VICODIN) 5-325 MG tablet Take 1 tablet by mouth every 6 (six) hours as needed for moderate pain.    Marland Kitchen ibuprofen (ADVIL,MOTRIN) 800 MG tablet Take 800 mg by mouth every 8 (eight) hours as needed for mild pain or moderate pain.    . Insulin Pen Needle (B-D UF III MINI PEN NEEDLES) 31G X 5 MM MISC USE TO INJECT MEDICATION 1 TIME PER DAY. 90 each 2  . Nicotine 21-14-7 MG/24HR KIT Place 1 patch onto the skin daily. 56 each 3  . NUVARING 0.12-0.015 MG/24HR vaginal ring INSERT 1 RING VAGINALLY FOR 3 WEEKS THEN 1 WEEK OFF.  3  . rosuvastatin (CRESTOR) 10 MG tablet Take 10 mg by mouth daily.    Marland Kitchen VICTOZA 18 MG/3ML SOPN INJECT 0.3 MLS  (1.8 MG TOTAL) INTO THE SKIN DAILY. AND PEN NEEDLES 1/DAY (Patient not taking: Reported on 02/05/2018) 3 pen 11   No current facility-administered medications on file prior to visit.     Allergies  Allergen Reactions  . Amoxicillin-Pot Clavulanate Diarrhea and Nausea And Vomiting    Pt stated "non-stop diarrhea"   . Morphine Other (See Comments)    Pt stated "I would rather not take- it doesn't make me feel normal" Pt stated "I would rather not take- it doesn't make me feel normal"     Family History  Problem Relation Age of Onset  . Hypertension Father   . Heart disease Father     BP 120/88 (BP Location: Left Arm, Patient Position: Sitting, Cuff Size: Normal)   Pulse 83   Ht '5\' 4"'  (1.626 m)   Wt 177 lb (80.3 kg)   SpO2 97%   BMI 30.38 kg/m    Review of Systems Denies neck pain    Objective:   Physical Exam VITAL SIGNS:  See vs page GENERAL: no distress Neck: a healed scar is present.  I do not appreciate a nodule in the thyroid or elsewhere in the neck.     Lab Results  Component Value Date   TSH 0.03 (L) 02/05/2018      Assessment & Plan:  Hypothyroidism: overreplaced. I have sent a prescription to your pharmacy, to reduce synthroid.  Recheck TSH 1 month

## 2018-02-05 NOTE — Patient Instructions (Addendum)
blood tests are requested for you today.  We'll let you know about the results.   Out goal is a normal thyroid level.    Please come back for a follow-up appointment in 2 years.

## 2018-02-06 ENCOUNTER — Telehealth: Payer: Self-pay

## 2018-02-06 LAB — THYROGLOBULIN LEVEL: Thyroglobulin: <0.1 ng/mL — ABNORMAL LOW

## 2018-02-06 LAB — THYROGLOBULIN ANTIBODY

## 2018-02-06 NOTE — Telephone Encounter (Signed)
Pt called back with a fax number so you can fax lab requisitions over to her PCP at [facility]. Fax # is [phone] Attn:Donna. Thanks.

## 2018-02-07 ENCOUNTER — Other Ambulatory Visit: Payer: Self-pay

## 2018-02-07 DIAGNOSIS — E890 Postprocedural hypothyroidism: Secondary | ICD-10-CM

## 2018-02-07 NOTE — Telephone Encounter (Signed)
Yes, please.

## 2018-02-07 NOTE — Telephone Encounter (Signed)
Patient wants to complete 1 month f/u labs at her PCP. I stated that we could fax labs to their office, but none have been entered. Would you like me to order just the TSH?

## 2018-02-10 ENCOUNTER — Other Ambulatory Visit: Payer: Self-pay

## 2018-02-10 DIAGNOSIS — E890 Postprocedural hypothyroidism: Secondary | ICD-10-CM

## 2018-02-10 NOTE — Telephone Encounter (Signed)
I have faxed to [facility].

## 2018-03-12 ENCOUNTER — Telehealth: Payer: Self-pay | Admitting: Endocrinology

## 2018-03-12 MED ORDER — LEVOTHYROXINE SODIUM 125 MCG PO TABS: 125.0000 ug | ORAL_TABLET | Freq: Every day | ORAL | 3 refills | Status: DC

## 2018-03-12 NOTE — Telephone Encounter (Signed)
Patient had her labs done 03/11/18 at her PCP office-patient wants to verify that Dr. Loanne Drilling has received the lab results. Please call patient at ph# [phone]

## 2018-03-12 NOTE — Telephone Encounter (Signed)
To recall reviewing these? I have not seen them?

## 2018-03-12 NOTE — Telephone Encounter (Addendum)
I got result.  I have sent a prescription to your pharmacy, to decrease synthroid to 125 mcg/day.

## 2018-03-12 NOTE — Telephone Encounter (Signed)
I spoke with patient & let her know new prescription was sent in after labs were received.

## 2018-04-28 ENCOUNTER — Telehealth: Payer: Self-pay | Admitting: Endocrinology

## 2018-04-28 NOTE — Telephone Encounter (Signed)
D/c victoza Please advise ov next available

## 2018-04-28 NOTE — Telephone Encounter (Signed)
Please advise 

## 2018-04-28 NOTE — Telephone Encounter (Signed)
appt made for tomorrow @9 

## 2018-04-28 NOTE — Telephone Encounter (Signed)
Per [facility]-Caller was advised to call Dr. Loanne Drilling to review her RX's during pregnancy. Ph# [phone]

## 2018-04-29 ENCOUNTER — Encounter (INDEPENDENT_AMBULATORY_CARE_PROVIDER_SITE_OTHER): Payer: Self-pay

## 2018-04-29 ENCOUNTER — Encounter: Payer: Self-pay | Admitting: Endocrinology

## 2018-04-29 ENCOUNTER — Ambulatory Visit (INDEPENDENT_AMBULATORY_CARE_PROVIDER_SITE_OTHER): Payer: BC Managed Care – PPO | Admitting: Endocrinology

## 2018-04-29 VITALS — BP 102/76 | HR 96 | Ht 64.0 in | Wt 186.4 lb

## 2018-04-29 DIAGNOSIS — E890 Postprocedural hypothyroidism: Secondary | ICD-10-CM | POA: Diagnosis not present

## 2018-04-29 LAB — T4, FREE: Free T4: 1.07 ng/dL (ref 0.60–1.60)

## 2018-04-29 LAB — TSH: TSH: 1.62 u[IU]/mL (ref 0.35–4.50)

## 2018-04-29 MED ORDER — LEVOTHYROXINE SODIUM 137 MCG PO TABS: 137.0000 ug | ORAL_TABLET | Freq: Every day | ORAL | 11 refills | Status: DC

## 2018-04-29 NOTE — Progress Notes (Signed)
Subjective:    Patient ID: Lisa Kirby, female    DOB: 07/10/1986, 32 y.o.   MRN: [id number]  HPI Pt returns for f/u of stage 1 differentiated thyroid cancer: 2014: pt was noted on a routine visit in [place] to have a multinodular goiter: bx was advised, but pt did not return for f/u. 5/15.  Needle bx was suspicious.  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   7/15: completion left lobectomy--no malignancy was found.  8/15: I-131, 50.2 mCi  8/15: post-therapy scan: uptake only in the thyroid bed consistent with remnant activity.   2/16: TG=2.2 (ab neg) 8/16: TG=0.2 (ab neg) 1/17: TG=0.1 (ab neg) 7/17: TG=0.1 (ab neg) 1/18: TG is undetectable (ab neg) She does not notice any nodule at the anterior neck.   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  She again reports fatigue.  She is [redacted] weeks pregnant.   She has been off victoza x 4 months.   Past Medical History:  Diagnosis Date  . Cancer (HCC)    PAPILLARY THYROID CANCER  . Headache(784.0)    NOT MIGRAINES  . Right thyroid nodule    REMOVED SURGICALLY 12/29/13 - DX WITH PAPILLARY THYROID CANCER - IS SCHEDULED FOR COMPLETION THYROIDECTOMY    Past Surgical History:  Procedure Laterality Date  . CESAREAN SECTION    . GALLBLADDER SURGERY    . THYROID LOBECTOMY Right 12/29/2013   Procedure: RIGHT THYROID LOBECTOMY;  Surgeon: Gayland Curry, MD;  Location: [facility];  Service: General;  Laterality: Right;  . THYROIDECTOMY N/A 01/20/2014   Procedure: COMPLETION THYROIDECTOMY;  Surgeon: Gayland Curry, MD;  Location: [facility];  Service: General;  Laterality: N/A;  . WISDOM TOOTH EXTRACTION      Social History   Socioeconomic History  . Marital status: Single    Spouse name: Not on file  . Number of children: Not on file  . Years of education: Not on file  . Highest education level: Not on file  Occupational History  . Not on file  Social Needs  . Financial resource strain: Not  on file  . Food insecurity:    Worry: Not on file    Inability: Not on file  . Transportation needs:    Medical: Not on file    Non-medical: Not on file  Tobacco Use  . Smoking status: Current Every Day Smoker    Packs/day: 0.50    Years: 9.00    Pack years: 4.50    Types: Cigarettes  . Smokeless tobacco: Never Used  Substance and Sexual Activity  . Alcohol use: No  . Drug use: No  . Sexual activity: Not on file  Lifestyle  . Physical activity:    Days per week: Not on file    Minutes per session: Not on file  . Stress: Not on file  Relationships  . Social connections:    Talks on phone: Not on file    Gets together: Not on file    Attends religious service: Not on file    Active member of club or organization: Not on file    Attends meetings of clubs or organizations: Not on file    Relationship status: Not on file  . Intimate partner violence:    Fear of current or ex partner: Not on file    Emotionally abused: Not on file    Physically abused: Not on file    Forced sexual activity: Not on file  Other  Topics Concern  . Not on file  Social History Narrative  . Not on file    Current Outpatient Medications on File Prior to Visit  Medication Sig Dispense Refill  . cyclobenzaprine (FLEXERIL) 10 MG tablet Take 10 mg by mouth 3 (three) times daily as needed for muscle spasms.    Marland Kitchen HYDROcodone-acetaminophen (NORCO/VICODIN) 5-325 MG tablet Take 1 tablet by mouth every 6 (six) hours as needed for moderate pain.    Marland Kitchen ibuprofen (ADVIL,MOTRIN) 800 MG tablet Take 800 mg by mouth every 8 (eight) hours as needed for mild pain or moderate pain.    . Insulin Pen Needle (B-D UF III MINI PEN NEEDLES) 31G X 5 MM MISC USE TO INJECT MEDICATION 1 TIME PER DAY. (Patient not taking: Reported on 04/29/2018) 90 each 2  . Nicotine 21-14-7 MG/24HR KIT Place 1 patch onto the skin daily. (Patient not taking: Reported on 04/29/2018) 56 each 3  . NUVARING 0.12-0.015 MG/24HR vaginal ring INSERT 1 RING  VAGINALLY FOR 3 WEEKS THEN 1 WEEK OFF.  3  . rosuvastatin (CRESTOR) 10 MG tablet Take 10 mg by mouth daily.    Marland Kitchen VICTOZA 18 MG/3ML SOPN INJECT 0.3 MLS (1.8 MG TOTAL) INTO THE SKIN DAILY. AND PEN NEEDLES 1/DAY (Patient not taking: Reported on 02/05/2018) 3 pen 11   No current facility-administered medications on file prior to visit.     Allergies  Allergen Reactions  . Amoxicillin-Pot Clavulanate Diarrhea and Nausea And Vomiting    Pt stated "non-stop diarrhea"   . Morphine Other (See Comments)    Pt stated "I would rather not take- it doesn't make me feel normal" Pt stated "I would rather not take- it doesn't make me feel normal"     Family History  Problem Relation Age of Onset  . Hypertension Father   . Heart disease Father     BP 102/76   Pulse 96   Ht 5' 4" (1.626 m)   Wt 186 lb 6.4 oz (84.6 kg)   SpO2 98%   BMI 32.00 kg/m    Review of Systems She has only slight nausea.      Objective:   Physical Exam VITAL SIGNS:  See vs page GENERAL: no distress Neck: a healed scar is present.  I do not appreciate a nodule in the thyroid or elsewhere in the neck.   Lab Results  Component Value Date   TSH 1.62 04/29/2018      Assessment & Plan:  Hypothyroidism: well-controlled Pregnancy, new.  In this setting, we'll slightly increase the synthroid  Patient Instructions  blood tests are requested for you today.  We'll let you know about the results.   Please come back for a follow-up appointment in 3 months.

## 2018-04-29 NOTE — Patient Instructions (Addendum)
blood tests are requested for you today.  We'll let you know about the results.   Please come back for a follow-up appointment in 3 months.   

## 2018-04-30 ENCOUNTER — Telehealth: Payer: Self-pay | Admitting: Endocrinology

## 2018-04-30 NOTE — Telephone Encounter (Signed)
I called pt this morning and lft a vm asking for her to return call. Will attempt to reach her again.

## 2018-04-30 NOTE — Telephone Encounter (Signed)
Patient is stating Dr. Loanne Drilling would get back to her on same day of labs for results and have not heard anything. Please Advise

## 2018-06-13 ENCOUNTER — Telehealth: Payer: Self-pay | Admitting: Endocrinology

## 2018-06-13 NOTE — Telephone Encounter (Signed)
She wanted you to know that her medication was increased to 150 mcg (due to recent lab results)and that she wanted to make you aware of any changes and she also wanted know if you were comfortable allowing her OBGYN follow her for her TSH for the remainder of her pregnancy. I informed pt that that decision was up to her.

## 2018-06-13 NOTE — Telephone Encounter (Signed)
Patient stated that she is pregnant and Dr Loanne Drilling wanted her to come see him every three months. When she went into her OBGYN they advised that she did not need to come into our office until after the baby was born. Patient did not feel comfortable doing that, she felt like she needed to continue being seen for her thyroid.  She wanted Dr Loanne Drilling to look through her chart and see if he is able to look at her labs that were done in their office. And if there is anything she needs to do or if she needs to come in to see him   Please advise  [facility] ([facility] [place])

## 2018-06-13 NOTE — Telephone Encounter (Signed)
error 

## 2018-06-13 NOTE — Telephone Encounter (Signed)
Please advise 

## 2018-06-13 NOTE — Telephone Encounter (Signed)
Chart says next ov here is due early jan, but we are happy to see you sooner if you want.

## 2018-07-30 ENCOUNTER — Ambulatory Visit: Payer: BC Managed Care – PPO | Admitting: Endocrinology

## 2018-07-30 ENCOUNTER — Telehealth: Payer: Self-pay | Admitting: Endocrinology

## 2018-07-30 NOTE — Telephone Encounter (Signed)
Needs ov next available appt.   

## 2018-07-30 NOTE — Telephone Encounter (Signed)
Please refer to Dr. Ellison's response 

## 2018-07-30 NOTE — Telephone Encounter (Signed)
Patient no showed today's appt. Please advise on how to follow up. °A. No follow up necessary. °B. Follow up urgent. Contact patient immediately. °C. Follow up necessary. Contact patient and schedule visit in ___ days. °D. Follow up advised. Contact patient and schedule visit in ____weeks. ° °Would you like the NS fee to be applied to this visit? ° °

## 2018-07-30 NOTE — Telephone Encounter (Signed)
LMTCB to reschedule missed appointment °

## 2018-12-26 ENCOUNTER — Other Ambulatory Visit: Payer: Self-pay

## 2018-12-26 ENCOUNTER — Ambulatory Visit (INDEPENDENT_AMBULATORY_CARE_PROVIDER_SITE_OTHER): Payer: BC Managed Care – PPO | Admitting: Endocrinology

## 2018-12-26 DIAGNOSIS — E890 Postprocedural hypothyroidism: Secondary | ICD-10-CM

## 2018-12-26 DIAGNOSIS — C73 Malignant neoplasm of thyroid gland: Secondary | ICD-10-CM

## 2018-12-26 MED ORDER — LEVOTHYROXINE SODIUM 175 MCG PO TABS: 175.0000 ug | ORAL_TABLET | Freq: Every day | ORAL | 11 refills | Status: DC

## 2018-12-26 NOTE — Progress Notes (Signed)
Subjective:    Patient ID: Lisa Kirby, female    DOB: 1986/05/23, 33 y.o.   MRN: [id number]  HPI telehealth visit today via doxy video visit.  Alternatives to telehealth are presented to this patient, and the patient agrees to the telehealth visit. Pt is advised of the cost of the visit, and agrees to this, also.   Patient is at home, and I am at the office.   Persons attending the telehealth visit: the patient and I Pt returns for f/u of stage 1 differentiated thyroid cancer: 2014: pt was noted on a routine visit in [place] to have a multinodular goiter: bx was advised, but pt did not return for f/u. 5/15.  Needle bx was suspicious.  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   7/15: completion left lobectomy--no malignancy was found.  8/15: I-131, 50.2 mCi  8/15: post-therapy scan: uptake only in the thyroid bed consistent with remnant activity.   2/16: TG=2.2 (ab neg) 8/16: TG=0.2 (ab neg) 1/17: TG=0.1 (ab neg) 7/17: TG=0.1 (ab neg) 1/18: TG is undetectable (ab neg)  postsurgical hypothyroidism: she takes synthroid as rx'ed.  It was increased to 175/d, 4 weeks ago.  She is 2 weeks postpartum.  She reports slight cramps in the legs, but no assoc swelling.  She is breast feeding Past Medical History:  Diagnosis Date  . Cancer (HCC)    PAPILLARY THYROID CANCER  . Headache(784.0)    NOT MIGRAINES  . Right thyroid nodule    REMOVED SURGICALLY 12/29/13 - DX WITH PAPILLARY THYROID CANCER - IS SCHEDULED FOR COMPLETION THYROIDECTOMY    Past Surgical History:  Procedure Laterality Date  . CESAREAN SECTION    . GALLBLADDER SURGERY    . THYROID LOBECTOMY Right 12/29/2013   Procedure: RIGHT THYROID LOBECTOMY;  Surgeon: Gayland Curry, MD;  Location: [facility];  Service: General;  Laterality: Right;  . THYROIDECTOMY N/A 01/20/2014   Procedure: COMPLETION THYROIDECTOMY;  Surgeon: Gayland Curry, MD;  Location: [facility];  Service: General;  Laterality: N/A;  .  WISDOM TOOTH EXTRACTION      Social History   Socioeconomic History  . Marital status: Single    Spouse name: Not on file  . Number of children: Not on file  . Years of education: Not on file  . Highest education level: Not on file  Occupational History  . Not on file  Social Needs  . Financial resource strain: Not on file  . Food insecurity:    Worry: Not on file    Inability: Not on file  . Transportation needs:    Medical: Not on file    Non-medical: Not on file  Tobacco Use  . Smoking status: Current Every Day Smoker    Packs/day: 0.50    Years: 9.00    Pack years: 4.50    Types: Cigarettes  . Smokeless tobacco: Never Used  Substance and Sexual Activity  . Alcohol use: No  . Drug use: No  . Sexual activity: Not on file  Lifestyle  . Physical activity:    Days per week: Not on file    Minutes per session: Not on file  . Stress: Not on file  Relationships  . Social connections:    Talks on phone: Not on file    Gets together: Not on file    Attends religious service: Not on file    Active member of club or organization: Not on file    Attends meetings  of clubs or organizations: Not on file    Relationship status: Not on file  . Intimate partner violence:    Fear of current or ex partner: Not on file    Emotionally abused: Not on file    Physically abused: Not on file    Forced sexual activity: Not on file  Other Topics Concern  . Not on file  Social History Narrative  . Not on file    Current Outpatient Medications on File Prior to Visit  Medication Sig Dispense Refill  . cyclobenzaprine (FLEXERIL) 10 MG tablet Take 10 mg by mouth 3 (three) times daily as needed for muscle spasms.    Marland Kitchen HYDROcodone-acetaminophen (NORCO/VICODIN) 5-325 MG tablet Take 1 tablet by mouth every 6 (six) hours as needed for moderate pain.    Marland Kitchen ibuprofen (ADVIL,MOTRIN) 800 MG tablet Take 800 mg by mouth every 8 (eight) hours as needed for mild pain or moderate pain.    . Insulin  Pen Needle (B-D UF III MINI PEN NEEDLES) 31G X 5 MM MISC USE TO INJECT MEDICATION 1 TIME PER DAY. 90 each 2  . Nicotine 21-14-7 MG/24HR KIT Place 1 patch onto the skin daily. 56 each 3  . NUVARING 0.12-0.015 MG/24HR vaginal ring INSERT 1 RING VAGINALLY FOR 3 WEEKS THEN 1 WEEK OFF.  3  . rosuvastatin (CRESTOR) 10 MG tablet Take 10 mg by mouth daily.     No current facility-administered medications on file prior to visit.     Family History  Problem Relation Age of Onset  . Hypertension Father   . Heart disease Father     Review of Systems Denies neck swelling or pain.      Objective:   Physical Exam      Assessment & Plan:  PTC: due for recheck Cramps, new, uncertain etiology Hypothyroidism: due for recheck.   Patient Instructions  Blood tests are requested for you today.  We'll let you know about the results.  Please come back for a follow-up appointment in 1-2 months.

## 2018-12-26 NOTE — Patient Instructions (Signed)
Blood tests are requested for you today.  We'll let you know about the results.  Please come back for a follow-up appointment in 1-2 months.

## 2019-01-16 ENCOUNTER — Telehealth: Payer: Self-pay

## 2019-01-16 NOTE — Telephone Encounter (Signed)
Called pt and made her aware of orders. Verbalized acceptance and understanding. Scheduled for f/u 02/16/19

## 2019-01-16 NOTE — Telephone Encounter (Signed)
Called pt to inquire further. States she completed yesterday at [facility]. Advised I will forward message to Dr. Loanne Drilling asking him to review results in [place]. Further advised I will call with further instuctions once he has reviewed and responded. Verbalized acceptance and understanding.

## 2019-01-16 NOTE — Telephone Encounter (Signed)
I reviewed.  It is in goal range. Please continue the same medication. Please come back for a follow-up appointment in 1-2 months.

## 2019-01-16 NOTE — Telephone Encounter (Signed)
Patient called today and stated she had labs done at [facility] and she would like to know from the results if her levothyroxine needs to be adjusted-please advise

## 2019-02-12 ENCOUNTER — Other Ambulatory Visit: Payer: Self-pay

## 2019-02-16 ENCOUNTER — Ambulatory Visit (INDEPENDENT_AMBULATORY_CARE_PROVIDER_SITE_OTHER): Payer: BC Managed Care – PPO | Admitting: Endocrinology

## 2019-02-16 ENCOUNTER — Encounter: Payer: Self-pay | Admitting: Endocrinology

## 2019-02-16 ENCOUNTER — Other Ambulatory Visit: Payer: Self-pay

## 2019-02-16 DIAGNOSIS — C73 Malignant neoplasm of thyroid gland: Secondary | ICD-10-CM | POA: Diagnosis not present

## 2019-02-16 DIAGNOSIS — E890 Postprocedural hypothyroidism: Secondary | ICD-10-CM

## 2019-02-16 MED ORDER — LEVOTHYROXINE SODIUM 150 MCG PO TABS: 150.0000 ug | ORAL_TABLET | Freq: Every day | ORAL | 3 refills | Status: DC

## 2019-02-16 NOTE — Progress Notes (Signed)
Subjective:    Patient ID: Lisa Kirby, female    DOB: [birth year], 33 y.o.   MRN: [id number]  HPI  telehealth visit today via doxy video visit.  Alternatives to telehealth are presented to this patient, and the patient agrees to the telehealth visit. Pt is advised of the cost of the visit, and agrees to this, also.   Patient is at home, and I am at the office.   Persons attending the telehealth visit: the patient and I. Pt returns for f/u of stage 1 differentiated thyroid cancer: 2014: pt was noted on a routine visit in [place] to have a multinodular goiter: bx was advised, but pt did not return for f/u.   5/15.  Needle bx was suspicious.  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   7/15: completion left lobectomy--no malignancy was found.  8/15: I-131, 50.2 mCi  8/15: post-therapy scan: uptake only in the thyroid bed consistent with remnant activity.   2/16: TG=2.2 (ab neg) 8/16: TG=0.2 (ab neg) 1/17: TG=0.1 (ab neg) 7/17: TG=0.1 (ab neg) 1/18: TG is undetectable (ab neg).   postsurgical hypothyroidism: goal TSH is normal, due to long disease-free interval.  she takes synthroid as rx'ed.  It was increased to 175/d, 4 weeks ago.  She is 2 weeks postpartum.  She reports moderate diaphoresis of the forehead, but no assoc palpitations.  She is breast feeding.   She does not take vit-D supplement.   Past Medical History:  Diagnosis Date  . Cancer (HCC)    PAPILLARY THYROID CANCER  . Headache(784.0)    NOT MIGRAINES  . Right thyroid nodule    REMOVED SURGICALLY 12/29/13 - DX WITH PAPILLARY THYROID CANCER - IS SCHEDULED FOR COMPLETION THYROIDECTOMY    Past Surgical History:  Procedure Laterality Date  . CESAREAN SECTION    . GALLBLADDER SURGERY    . THYROID LOBECTOMY Right 12/29/2013   Procedure: RIGHT THYROID LOBECTOMY;  Surgeon: Gayland Curry, MD;  Location: [facility];  Service: General;  Laterality: Right;  . THYROIDECTOMY N/A 01/20/2014   Procedure: COMPLETION THYROIDECTOMY;  Surgeon: Gayland Curry, MD;  Location: [facility];  Service: General;  Laterality: N/A;  . WISDOM TOOTH EXTRACTION      Social History   Socioeconomic History  . Marital status: Single    Spouse name: Not on file  . Number of children: Not on file  . Years of education: Not on file  . Highest education level: Not on file  Occupational History  . Not on file  Social Needs  . Financial resource strain: Not on file  . Food insecurity    Worry: Not on file    Inability: Not on file  . Transportation needs    Medical: Not on file    Non-medical: Not on file  Tobacco Use  . Smoking status: Current Every Day Smoker    Packs/day: 0.50    Years: 9.00    Pack years: 4.50    Types: Cigarettes  . Smokeless tobacco: Never Used  Substance and Sexual Activity  . Alcohol use: No  . Drug use: No  . Sexual activity: Not on file  Lifestyle  . Physical activity    Days per week: Not on file    Minutes per session: Not on file  . Stress: Not on file  Relationships  . Social Herbalist on phone: Not on file    Gets together: Not on file  Attends religious service: Not on file    Active member of club or organization: Not on file    Attends meetings of clubs or organizations: Not on file    Relationship status: Not on file  . Intimate partner violence    Fear of current or ex partner: Not on file    Emotionally abused: Not on file    Physically abused: Not on file    Forced sexual activity: Not on file  Other Topics Concern  . Not on file  Social History Narrative  . Not on file    Current Outpatient Medications on File Prior to Visit  Medication Sig Dispense Refill  . cyclobenzaprine (FLEXERIL) 10 MG tablet Take 10 mg by mouth 3 (three) times daily as needed for muscle spasms.    Marland Kitchen HYDROcodone-acetaminophen (NORCO/VICODIN) 5-325 MG tablet Take 1 tablet by mouth every 6 (six) hours as needed for moderate pain.    Marland Kitchen ibuprofen  (ADVIL,MOTRIN) 800 MG tablet Take 800 mg by mouth every 8 (eight) hours as needed for mild pain or moderate pain.    . Insulin Pen Needle (B-D UF III MINI PEN NEEDLES) 31G X 5 MM MISC USE TO INJECT MEDICATION 1 TIME PER DAY. 90 each 2  . Nicotine 21-14-7 MG/24HR KIT Place 1 patch onto the skin daily. 56 each 3  . NUVARING 0.12-0.015 MG/24HR vaginal ring INSERT 1 RING VAGINALLY FOR 3 WEEKS THEN 1 WEEK OFF.  3  . rosuvastatin (CRESTOR) 10 MG tablet Take 10 mg by mouth daily.     No current facility-administered medications on file prior to visit.     Allergies  Allergen Reactions  . Amoxicillin-Pot Clavulanate Diarrhea and Nausea And Vomiting    Pt stated "non-stop diarrhea"   . Morphine Other (See Comments)    Pt stated "I would rather not take- it doesn't make me feel normal" Pt stated "I would rather not take- it doesn't make me feel normal"     Family History  Problem Relation Age of Onset  . Hypertension Father   . Heart disease Father     There were no vitals taken for this visit.   Review of Systems Denies cramps and tremor    Objective:   Physical Exam    outside test results are reviewed: TSH=0.115    Assessment & Plan:  Hypothyroidism: ovrereplaced. PTC-FV: due for recheck.  Unless labs show a recurrence, goal TSH can be normal  Patient Instructions  Please reduce the thyroid pill to 150 mcg/day.   Please do the cancer test with your primary care provider.   Please come back for a follow-up appointment in 6 months.

## 2019-02-16 NOTE — Patient Instructions (Addendum)
Please reduce the thyroid pill to 150 mcg/day.   Please do the cancer test with your primary care provider.   Please come back for a follow-up appointment in 6 months.

## 2019-05-18 ENCOUNTER — Other Ambulatory Visit: Payer: Self-pay

## 2019-05-20 ENCOUNTER — Encounter: Payer: Self-pay | Admitting: Endocrinology

## 2019-05-20 ENCOUNTER — Other Ambulatory Visit: Payer: Self-pay

## 2019-05-20 ENCOUNTER — Ambulatory Visit (INDEPENDENT_AMBULATORY_CARE_PROVIDER_SITE_OTHER): Payer: BC Managed Care – PPO | Admitting: Endocrinology

## 2019-05-20 VITALS — BP 110/66 | HR 88 | Ht 64.0 in | Wt 207.0 lb

## 2019-05-20 DIAGNOSIS — E890 Postprocedural hypothyroidism: Secondary | ICD-10-CM | POA: Diagnosis not present

## 2019-05-20 DIAGNOSIS — C73 Malignant neoplasm of thyroid gland: Secondary | ICD-10-CM

## 2019-05-20 NOTE — Patient Instructions (Addendum)
Blood tests are requested for you today.  We'll let you know about the results.  Please come back for a follow-up appointment in 6-12 months.   

## 2019-05-20 NOTE — Progress Notes (Signed)
 Subjective:    Patient ID: Lisa Kirby, female    DOB: 03/01/1986, 33 y.o.   MRN: [id number]  HPI  The state of at least three ongoing medical problems is addressed today, with interval history of each noted here: Pt returns for f/u of stage 1 differentiated thyroid cancer: 2014: pt was noted on a routine visit in [place] to have a multinodular goiter: bx was advised, but pt did not return for f/u.   5/15.  Needle bx was suspicious.  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   7/15: completion left lobectomy--no malignancy was found.  8/15: I-131, 50.2 mCi  8/15: post-therapy scan: uptake only in the thyroid bed consistent with remnant activity.   2/16: TG=2.2 (ab neg) 8/16: TG=0.2 (ab neg) 1/17: TG=0.1 (ab neg) 7/17: TG=0.1 (ab neg) 1/18: TG is undetectable (ab neg).   7/19: TG is undetectable (ab neg). Denies neck swelling.  This is a stable problem. postsurgical hypothyroidism: goal TSH is normal, due to long disease-free interval.  she takes synthroid as rx'ed.  Pt had TL.  Denies ankle swelling.  She has weight gain.  This is a stable problem. 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  Denies cramps.  This is a stable problem. Past Medical History:  Diagnosis Date  . Cancer (HCC)    PAPILLARY THYROID CANCER  . Headache(784.0)    NOT MIGRAINES  . Right thyroid nodule    REMOVED SURGICALLY 12/29/13 - DX WITH PAPILLARY THYROID CANCER - IS SCHEDULED FOR COMPLETION THYROIDECTOMY    Past Surgical History:  Procedure Laterality Date  . CESAREAN SECTION    . GALLBLADDER SURGERY    . THYROID LOBECTOMY Right 12/29/2013   Procedure: RIGHT THYROID LOBECTOMY;  Surgeon: Eric M Wilson, MD;  Location: [facility];  Service: General;  Laterality: Right;  . THYROIDECTOMY N/A 01/20/2014   Procedure: COMPLETION THYROIDECTOMY;  Surgeon: Eric M Wilson, MD;  Location: [facility];  Service: General;  Laterality: N/A;  . WISDOM TOOTH EXTRACTION      Social History    Socioeconomic History  . Marital status: Single    Spouse name: Not on file  . Number of children: Not on file  . Years of education: Not on file  . Highest education level: Not on file  Occupational History  . Not on file  Social Needs  . Financial resource strain: Not on file  . Food insecurity    Worry: Not on file    Inability: Not on file  . Transportation needs    Medical: Not on file    Non-medical: Not on file  Tobacco Use  . Smoking status: Current Every Day Smoker    Packs/day: 0.50    Years: 9.00    Pack years: 4.50    Types: Cigarettes  . Smokeless tobacco: Never Used  Substance and Sexual Activity  . Alcohol use: No  . Drug use: No  . Sexual activity: Not on file  Lifestyle  . Physical activity    Days per week: Not on file    Minutes per session: Not on file  . Stress: Not on file  Relationships  . Social connections    Talks on phone: Not on file    Gets together: Not on file    Attends religious service: Not on file    Active member of club or organization: Not on file    Attends meetings of clubs or organizations: Not on file      Relationship status: Not on file  . Intimate partner violence    Fear of current or ex partner: Not on file    Emotionally abused: Not on file    Physically abused: Not on file    Forced sexual activity: Not on file  Other Topics Concern  . Not on file  Social History Narrative  . Not on file    Current Outpatient Medications on File Prior to Visit  Medication Sig Dispense Refill  . cyclobenzaprine (FLEXERIL) 10 MG tablet Take 10 mg by mouth 3 (three) times daily as needed for muscle spasms.    . HYDROcodone-acetaminophen (NORCO/VICODIN) 5-325 MG tablet Take 1 tablet by mouth every 6 (six) hours as needed for moderate pain.    . ibuprofen (ADVIL,MOTRIN) 800 MG tablet Take 800 mg by mouth every 8 (eight) hours as needed for mild pain or moderate pain.    . Insulin Pen Needle (B-D UF III MINI PEN NEEDLES) 31G X 5 MM MISC  USE TO INJECT MEDICATION 1 TIME PER DAY. 90 each 2  . levothyroxine (SYNTHROID) 150 MCG tablet Take 1 tablet (150 mcg total) by mouth daily before breakfast. 90 tablet 3  . Nicotine 21-14-7 MG/24HR KIT Place 1 patch onto the skin daily. 56 each 3  . NUVARING 0.12-0.015 MG/24HR vaginal ring INSERT 1 RING VAGINALLY FOR 3 WEEKS THEN 1 WEEK OFF.  3  . rosuvastatin (CRESTOR) 10 MG tablet Take 10 mg by mouth daily.     No current facility-administered medications on file prior to visit.     Allergies  Allergen Reactions  . Amoxicillin-Pot Clavulanate Diarrhea and Nausea And Vomiting    Pt stated "non-stop diarrhea"   . Morphine Other (See Comments)    Pt stated "I would rather not take- it doesn't make me feel normal" Pt stated "I would rather not take- it doesn't make me feel normal"     Family History  Problem Relation Age of Onset  . Hypertension Father   . Heart disease Father     BP 110/66 (BP Location: Left Arm, Patient Position: Sitting, Cuff Size: Large)   Pulse 88   Ht 5' 4" (1.626 m)   Wt 207 lb (93.9 kg)   SpO2 94%   BMI 35.53 kg/m    Review of Systems Denies neck pain and numbness.      Objective:   Physical Exam VITAL SIGNS:  See vs page GENERAL: no distress Neck: a healed scar is present.  I do not appreciate a nodule in the thyroid or elsewhere in the neck.    Lab Results  Component Value Date   TSH 2.17 05/20/2019      Assessment & Plan:  Hypothyroidism: well-replaced Weight gain: the thyroid does not contraindicate weight loss medication.   PTC: recheck today.  Vit-D def: recheck today.  Patient Instructions  Blood tests are requested for you today.  We'll let you know about the results.  Please come back for a follow-up appointment in 6-12 months.     

## 2019-05-21 LAB — THYROGLOBULIN ANTIBODY: Thyroglobulin Ab: <1 IU/mL (ref <=1)

## 2019-05-21 LAB — THYROGLOBULIN LEVEL: Thyroglobulin: 0.1 ng/mL — ABNORMAL LOW

## 2019-05-21 LAB — PTH, INTACT AND CALCIUM
Calcium: 8.9 mg/dL (ref 8.6–10.2)
PTH: 13 pg/mL — ABNORMAL LOW (ref 14–64)

## 2019-05-22 LAB — TSH: TSH: 2.17 u[IU]/mL (ref 0.35–4.50)

## 2019-05-22 LAB — VITAMIN D 25 HYDROXY (VIT D DEFICIENCY, FRACTURES): VITD: 42.79 ng/mL (ref 30.00–100.00)

## 2019-05-22 LAB — T4, FREE: Free T4: 0.99 ng/dL (ref 0.60–1.60)

## 2019-05-25 ENCOUNTER — Telehealth: Payer: Self-pay | Admitting: Endocrinology

## 2019-05-25 ENCOUNTER — Other Ambulatory Visit: Payer: Self-pay

## 2019-05-25 DIAGNOSIS — E890 Postprocedural hypothyroidism: Secondary | ICD-10-CM

## 2019-05-25 MED ORDER — LEVOTHYROXINE SODIUM 150 MCG PO TABS: 150.0000 ug | ORAL_TABLET | Freq: Every day | ORAL | 3 refills | Status: DC

## 2019-05-25 NOTE — Telephone Encounter (Signed)
Patient wants to know what her lab results are and if her medication needs to change.  She also needs a refill on Levothyroxine (current dose or new dose based on labs)  MEDICATION:   PHARMACY:  [facility] in [place] :  yes  IS PATIENT OUT OF MEDICATION: YES  IF NOT; HOW MUCH IS LEFT:   LAST APPOINTMENT DATE: @10 /28/2020  NEXT APPOINTMENT DATE:@4 /28/2021  DO WE HAVE YOUR PERMISSION TO LEAVE A DETAILED MESSAGE: yes  OTHER COMMENTS:    **Let patient know to contact pharmacy at the end of the day to make sure medication is ready. **  ** Please notify patient to allow 48-72 hours to process**  **Encourage patient to contact the pharmacy for refills or they can request refills through [facility]**

## 2019-05-25 NOTE — Telephone Encounter (Signed)
Notes recorded by Aleatha Borer, LPN on [id number] at 1:57 PM EDT  Letter mailed  ------   Notes recorded by Renato Shin, MD on 05/22/2019 at 1:17 PM EDT  please contact patient:  Good results. Please continue the same medications.  Updated Rx sent as requested

## 2019-11-18 ENCOUNTER — Ambulatory Visit: Payer: BC Managed Care – PPO | Admitting: Endocrinology

## 2020-03-29 ENCOUNTER — Other Ambulatory Visit: Payer: Self-pay

## 2020-03-29 ENCOUNTER — Encounter: Payer: Self-pay | Admitting: Endocrinology

## 2020-03-29 ENCOUNTER — Ambulatory Visit (INDEPENDENT_AMBULATORY_CARE_PROVIDER_SITE_OTHER): Payer: BC Managed Care – PPO | Admitting: Endocrinology

## 2020-03-29 VITALS — BP 118/70 | HR 71 | Resp 18 | Ht 64.0 in | Wt 198.4 lb

## 2020-03-29 DIAGNOSIS — C73 Malignant neoplasm of thyroid gland: Secondary | ICD-10-CM | POA: Diagnosis not present

## 2020-03-29 DIAGNOSIS — E890 Postprocedural hypothyroidism: Secondary | ICD-10-CM | POA: Diagnosis not present

## 2020-03-29 LAB — T4, FREE: Free T4: 1.06 ng/dL (ref 0.60–1.60)

## 2020-03-29 LAB — VITAMIN D 25 HYDROXY (VIT D DEFICIENCY, FRACTURES): VITD: 37.99 ng/mL (ref 30.00–100.00)

## 2020-03-29 LAB — TSH: TSH: 1.63 u[IU]/mL (ref 0.35–4.50)

## 2020-03-29 NOTE — Patient Instructions (Signed)
Blood tests are requested for you today.  We'll let you know about the results.  Please come back for a follow-up appointment in 6-12 months.   

## 2020-03-29 NOTE — Progress Notes (Signed)
Subjective:    Patient ID: Lisa Kirby, female    DOB: [birth year], 34 y.o.   MRN: [id number]  HPI Pt returns for f/u of stage 1 differentiated thyroid cancer: 2014: pt was noted on a routine visit in [place] to have a multinodular goiter: bx was advised, but pt did not return for f/u.   5/15.  Needle bx was suspicious.  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   7/15: completion left lobectomy--no malignancy was found.  8/15: I-131, 50.2 mCi  8/15: post-therapy scan: uptake only in the thyroid bed consistent with remnant activity.   2/16: TG=2.2 (ab neg) 8/16: TG=0.2 (ab neg) 1/17: TG=0.1 (ab neg) 7/17: TG=0.1 (ab neg) 1/18: TG is undetectable (ab neg).   7/19: TG is undetectable (ab neg). 10/20 TG is undetectable (ab neg). Denies neck swelling.   postsurgical hypothyroidism: goal TSH is normal, due to long disease-free interval.  she takes synthroid as rx'ed.  Pt had TAH 2021.  She has insomnia and anxiety.  Vit-D def: she does not take supplement.    Past Medical History:  Diagnosis Date  . Cancer (HCC)    PAPILLARY THYROID CANCER  . Headache(784.0)    NOT MIGRAINES  . Right thyroid nodule    REMOVED SURGICALLY 12/29/13 - DX WITH PAPILLARY THYROID CANCER - IS SCHEDULED FOR COMPLETION THYROIDECTOMY    Past Surgical History:  Procedure Laterality Date  . CESAREAN SECTION    . GALLBLADDER SURGERY    . THYROID LOBECTOMY Right 12/29/2013   Procedure: RIGHT THYROID LOBECTOMY;  Surgeon: Gayland Curry, MD;  Location: [facility];  Service: General;  Laterality: Right;  . THYROIDECTOMY N/A 01/20/2014   Procedure: COMPLETION THYROIDECTOMY;  Surgeon: Gayland Curry, MD;  Location: [facility];  Service: General;  Laterality: N/A;  . WISDOM TOOTH EXTRACTION      Social History   Socioeconomic History  . Marital status: Single    Spouse name: Not on file  . Number of children: Not on file  . Years of education: Not on file  . Highest education level: Not on file  Occupational History    . Not on file  Tobacco Use  . Smoking status: Current Every Day Smoker    Packs/day: 0.50    Years: 9.00    Pack years: 4.50    Types: Cigarettes  . Smokeless tobacco: Never Used  Substance and Sexual Activity  . Alcohol use: No  . Drug use: No  . Sexual activity: Not on file  Other Topics Concern  . Not on file  Social History Narrative  . Not on file   Social Determinants of Health   Financial Resource Strain:   . Difficulty of Paying Living Expenses: Not on file  Food Insecurity:   . Worried About Charity fundraiser in the Last Year: Not on file  . Ran Out of Food in the Last Year: Not on file  Transportation Needs:   . Lack of Transportation (Medical): Not on file  . Lack of Transportation (Non-Medical): Not on file  Physical Activity:   . Days of Exercise per Week: Not on file  . Minutes of Exercise per Session: Not on file  Stress:   . Feeling of Stress : Not on file  Social Connections:   . Frequency of Communication with Friends and Family: Not on file  . Frequency of Social Gatherings with Friends and Family: Not on file  . Attends Religious Services: Not on file  . Active  Member of Clubs or Organizations: Not on file  . Attends Archivist Meetings: Not on file  . Marital Status: Not on file  Intimate Partner Violence:   . Fear of Current or Ex-Partner: Not on file  . Emotionally Abused: Not on file  . Physically Abused: Not on file  . Sexually Abused: Not on file    Current Outpatient Medications on File Prior to Visit  Medication Sig Dispense Refill  . cyclobenzaprine (FLEXERIL) 10 MG tablet Take 10 mg by mouth 3 (three) times daily as needed for muscle spasms.    Marland Kitchen HYDROcodone-acetaminophen (NORCO/VICODIN) 5-325 MG tablet Take 1 tablet by mouth as needed for moderate pain.     Marland Kitchen ibuprofen (ADVIL,MOTRIN) 800 MG tablet Take 800 mg by mouth every 8 (eight) hours as needed for mild pain or moderate pain.    Marland Kitchen levothyroxine (SYNTHROID) 150 MCG  tablet Take 1 tablet (150 mcg total) by mouth daily before breakfast. 90 tablet 3  . rosuvastatin (CRESTOR) 10 MG tablet Take 10 mg by mouth daily.     No current facility-administered medications on file prior to visit.    Allergies  Allergen Reactions  . Amoxicillin-Pot Clavulanate Diarrhea and Nausea And Vomiting    Pt stated "non-stop diarrhea"   . Morphine Other (See Comments)    Pt stated "I would rather not take- it doesn't make me feel normal" Pt stated "I would rather not take- it doesn't make me feel normal"     Family History  Problem Relation Age of Onset  . Hypertension Father   . Heart disease Father     BP 118/70   Pulse 71   Resp 18   Ht '5\' 4"'  (1.626 m)   Wt 198 lb 6.4 oz (90 kg)   SpO2 98%   Breastfeeding Unknown   BMI 34.06 kg/m    Review of Systems Denies numbness and muscle cramps.      Objective:   Physical Exam VITAL SIGNS:  See vs page GENERAL: no distress NECK: There is no palpable thyroid enlargement.  No thyroid nodule is palpable.  No palpable lymphadenopathy at the anterior neck.     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  Please continue the same medication.   Vit-D def: recheck today.  Patient Instructions  Blood tests are requested for you today.  We'll let you know about the results.  Please come back for a follow-up appointment in 6-12 months.

## 2020-03-30 LAB — THYROGLOBULIN LEVEL: Thyroglobulin: 0.1 ng/mL — ABNORMAL LOW

## 2020-03-30 LAB — PTH, INTACT AND CALCIUM
Calcium: 9 mg/dL (ref 8.6–10.2)
PTH: 17 pg/mL (ref 14–64)

## 2020-03-30 LAB — THYROGLOBULIN ANTIBODY: Thyroglobulin Ab: <1 IU/mL (ref <=1)

## 2020-04-01 ENCOUNTER — Encounter: Payer: Self-pay | Admitting: Endocrinology

## 2020-05-04 ENCOUNTER — Telehealth: Payer: Self-pay | Admitting: Endocrinology

## 2020-05-04 NOTE — Telephone Encounter (Signed)
Patient aware of results and recommendations. °

## 2020-05-04 NOTE — Telephone Encounter (Signed)
Patient requests to be called at ph# [phone] to have patient's lab results explained to her

## 2020-08-02 ENCOUNTER — Other Ambulatory Visit: Payer: Self-pay | Admitting: Endocrinology

## 2020-08-02 DIAGNOSIS — E890 Postprocedural hypothyroidism: Secondary | ICD-10-CM

## 2020-08-02 NOTE — Telephone Encounter (Signed)
Last OV 03/29/20 Last refill 05/2019. Please advise

## 2021-01-10 ENCOUNTER — Ambulatory Visit: Payer: Self-pay | Admitting: Endocrinology

## 2021-04-04 ENCOUNTER — Ambulatory Visit: Payer: Self-pay | Admitting: Endocrinology

## 2021-08-24 ENCOUNTER — Other Ambulatory Visit: Payer: Self-pay | Admitting: Endocrinology

## 2021-08-24 DIAGNOSIS — E890 Postprocedural hypothyroidism: Secondary | ICD-10-CM

## 2021-09-01 ENCOUNTER — Other Ambulatory Visit: Payer: Self-pay

## 2021-09-01 ENCOUNTER — Encounter: Payer: Self-pay | Admitting: Endocrinology

## 2021-09-01 ENCOUNTER — Ambulatory Visit (INDEPENDENT_AMBULATORY_CARE_PROVIDER_SITE_OTHER): Payer: Self-pay | Admitting: Endocrinology

## 2021-09-01 VITALS — BP 112/80 | HR 76 | Ht 64.0 in | Wt 205.6 lb

## 2021-09-01 DIAGNOSIS — C73 Malignant neoplasm of thyroid gland: Secondary | ICD-10-CM

## 2021-09-01 DIAGNOSIS — E890 Postprocedural hypothyroidism: Secondary | ICD-10-CM

## 2021-09-01 LAB — TSH: TSH: 0.41 u[IU]/mL (ref 0.35–5.50)

## 2021-09-01 LAB — T4, FREE: Free T4: 1.05 ng/dL (ref 0.60–1.60)

## 2021-09-01 NOTE — Progress Notes (Signed)
Subjective:    Patient ID: Lisa Kirby, female    DOB: Aug 22, 1985, 36 y.o.   MRN: [id number]  HPI Pt returns for f/u of stage 1 differentiated thyroid cancer: 2014: pt was noted on a routine visit in [place] to have a multinodular goiter: bx was advised, but pt did not return for f/u.   5/15.  Needle bx was suspicious.  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   7/15: completion left lobectomy--no malignancy was found.  8/15: I-131, 50 mCi  8/15: post-therapy scan: uptake only in the thyroid bed consistent with remnant activity.   2/16: TG=2.2 (ab neg) 8/16: TG=0.2 (ab neg) 1/17: TG=0.1 (ab neg) 7/17: TG=0.1 (ab neg) 1/18: TG is undetectable (ab neg).   7/19: TG is undetectable (ab neg). 10/20 TG is undetectable(ab neg).  9/21 TG=0.1 (Ab neg) Denies neck swelling or nodule.   postsurgical hypothyroidism: goal TSH is normal, due to long disease-free interval.  she takes synthroid as rx'ed.  Pt had TAH 2021.   Vit-D def: she does not take supplement.   Past Medical History:  Diagnosis Date   Cancer ([place])    PAPILLARY THYROID CANCER   Headache(784.0)    NOT MIGRAINES   Right thyroid nodule    REMOVED SURGICALLY 12/29/13 - DX WITH PAPILLARY THYROID CANCER - IS SCHEDULED FOR COMPLETION THYROIDECTOMY    Past Surgical History:  Procedure Laterality Date   CESAREAN SECTION     GALLBLADDER SURGERY     THYROID LOBECTOMY Right 12/29/2013   Procedure: RIGHT THYROID LOBECTOMY;  Surgeon: Gayland Curry, MD;  Location: [facility];  Service: General;  Laterality: Right;   THYROIDECTOMY N/A 01/20/2014   Procedure: COMPLETION THYROIDECTOMY;  Surgeon: Gayland Curry, MD;  Location: [facility];  Service: General;  Laterality: N/A;   WISDOM TOOTH EXTRACTION      Social History   Socioeconomic History   Marital status: Single    Spouse name: Not on file   Number of children: Not on file   Years of education: Not on file   Highest education level: Not on file  Occupational History   Not on file   Tobacco Use   Smoking status: Every Day    Packs/day: 0.50    Years: 9.00    Pack years: 4.50    Types: Cigarettes   Smokeless tobacco: Never  Substance and Sexual Activity   Alcohol use: No   Drug use: No   Sexual activity: Not on file  Other Topics Concern   Not on file  Social History Narrative   Not on file   Social Determinants of Health   Financial Resource Strain: Not on file  Food Insecurity: Not on file  Transportation Needs: Not on file  Physical Activity: Not on file  Stress: Not on file  Social Connections: Not on file  Intimate Partner Violence: Not on file    Current Outpatient Medications on File Prior to Visit  Medication Sig Dispense Refill   cyclobenzaprine (FLEXERIL) 10 MG tablet Take 10 mg by mouth 3 (three) times daily as needed for muscle spasms.     HYDROcodone-acetaminophen (NORCO/VICODIN) 5-325 MG tablet Take 1 tablet by mouth as needed for moderate pain.      ibuprofen (ADVIL,MOTRIN) 800 MG tablet Take 800 mg by mouth every 8 (eight) hours as needed for mild pain or moderate pain.     levothyroxine (SYNTHROID) 150 MCG tablet TAKE 1 TABLET BY MOUTH DAILY BEFORE BREAKFAST. 90 tablet 3   rosuvastatin (  CRESTOR) 10 MG tablet Take 10 mg by mouth daily.     No current facility-administered medications on file prior to visit.      Family History  Problem Relation Age of Onset   Hypertension Father    Heart disease Father     BP 112/80    Pulse 76    Ht '5\' 4"'  (1.626 m)    Wt 205 lb 9.6 oz (93.3 kg)    SpO2 98%    BMI 35.29 kg/m    Review of Systems     Objective:   Physical Exam   Lab Results  Component Value Date   TSH 0.41 09/01/2021      Assessment & Plan:  Hypothyroidism: well-controlled.  Please continue the same synthroid.  PTC: due for recheck  Patient Instructions  Blood tests are requested for you today.  We'll let you know about the results.   Please come back for a follow-up appointment in 1 year.

## 2021-09-01 NOTE — Patient Instructions (Addendum)
Blood tests are requested for you today.  We'll let you know about the results.  Please come back for a follow-up appointment in 1 year.    

## 2021-09-04 LAB — THYROGLOBULIN LEVEL: Thyroglobulin: 0.1 ng/mL — ABNORMAL LOW

## 2021-09-04 LAB — THYROGLOBULIN ANTIBODY: Thyroglobulin Ab: <1 IU/mL (ref <=1)

## 2021-09-18 ENCOUNTER — Other Ambulatory Visit: Payer: Self-pay

## 2021-09-18 DIAGNOSIS — E890 Postprocedural hypothyroidism: Secondary | ICD-10-CM

## 2021-09-18 MED ORDER — LEVOTHYROXINE SODIUM 150 MCG PO TABS: 150.0000 ug | ORAL_TABLET | Freq: Every day | ORAL | 3 refills | Status: DC

## 2022-11-16 ENCOUNTER — Encounter: Payer: Self-pay | Admitting: Internal Medicine

## 2022-11-16 ENCOUNTER — Ambulatory Visit (INDEPENDENT_AMBULATORY_CARE_PROVIDER_SITE_OTHER): Payer: Self-pay | Admitting: Internal Medicine

## 2022-11-16 VITALS — BP 120/74 | HR 63 | Ht 64.0 in | Wt 210.4 lb

## 2022-11-16 DIAGNOSIS — C73 Malignant neoplasm of thyroid gland: Secondary | ICD-10-CM

## 2022-11-16 DIAGNOSIS — E890 Postprocedural hypothyroidism: Secondary | ICD-10-CM

## 2022-11-16 LAB — T4, FREE: Free T4: 1.13 ng/dL (ref 0.60–1.60)

## 2022-11-16 LAB — TSH: TSH: 0.25 u[IU]/mL — ABNORMAL LOW (ref 0.35–5.50)

## 2022-11-16 MED ORDER — LEVOTHYROXINE SODIUM 137 MCG PO TABS: 137.0000 ug | ORAL_TABLET | Freq: Every day | ORAL | 5 refills | Status: DC

## 2022-11-16 NOTE — Progress Notes (Unsigned)
Patient ID: Lisa Kirby, female   DOB: 03/18/1986, 37 y.o.   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  She previously saw Dr. Everardo All, last visit with him 1 year and 2 months ago. Patient lives an hour away.  It is difficult for her to come to [facility] for her appointments.  Pt. has been dx with thyroid cancer in 2014.   Reviewed Dr. George Hugh last note and the patient's chart: 2014: pt was noted on a routine visit (at that time, she remembers having had sore throat, hair loss) to have a multinodular goiter: bx was advised  5/15:  Needle bx was suspicious.  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 - SEE ONCOLOGY TABLE Microscopic Comment THYROID Specimen: Right thyroid lobe Procedure: Lobectomy Specimen Integrity (intact/fragmented): Intact Tumor focality: One focus in this lobe Dominant tumor: Maximum tumor size (cm): 2.2 cm Histologic type (including subtype and/or unique features as applicable): Encapsulated follicular variant of papillary carcinoma Tumor capsule: Yes Extrathyroidal extension: No Margins: Negative Lymph - Vascular invasion: No Capsular invasion with degree of invasion if present: N/A Lymph nodes: # examined N/A; # positive; N/A TNM code: pT2, pNX Non-neoplastic thyroid: Unremarkable. 7/15: completion left lobectomy--no malignancy was found.  03/10/14: I-131, 50 mCi  03/19/14: post-therapy scan:  Uptake in the thyroid bed consistent with remnant activity. Cannot exclude a small lymph node metastasis.  No evidence of distant metastasis.   Review thyroglobulin and antithyroglobulin antibody levels: Lab Results  Component Value Date   THYROGLB 0.1 (L) 09/01/2021   THYROGLB 0.1 (L)  03/29/2020   THYROGLB 0.1 (L) 05/20/2019   THYROGLB <0.1 (L) 02/05/2018   THYROGLB 0.1 (L) 08/09/2017   THYROGLB <0.1 (L) 02/06/2017   THYROGLB <0.1 (L) 08/01/2016   THYROGLB 0.1 (L) 01/30/2016   THYROGLB 0.1 (L) 07/26/2015   THYROGLB 0.2 (L) 02/28/2015   THYROGLB 2.2 (L) 08/25/2014   THGAB <1 09/01/2021   THGAB <1 03/29/2020   THGAB <1 05/20/2019   THGAB <1 02/05/2018   THGAB <1 08/09/2017   THGAB <1 02/06/2017   THGAB <1 08/01/2016   THGAB <1 01/30/2016   THGAB <1 07/26/2015   THGAB <1 02/28/2015   THGAB <1 08/25/2014   Pt denies: - feeling nodules in neck - hoarseness - dysphagia - choking  Postsurgical hypothyroidism: Pt is on levothyroxine 150 mcg daily, taken: - in am (5:30 am) -has to be at work at 6 am (day care) - fasting - at least 1h from b'fast - no calcium - no iron - took multivitamins sporadically, ~2h after LT4. - no PPIs - not on Biotin  I reviewed pt's thyroid tests: Lab Results  Component Value Date   TSH 0.41 09/01/2021   TSH 1.63 03/29/2020   TSH 2.17 05/20/2019   TSH 1.62 04/29/2018   TSH 0.03 (L) 02/05/2018   TSH 0.40 08/09/2017   TSH 0.01 (L) 02/06/2017   TSH 3.32 08/01/2016   TSH 0.36 01/30/2016   TSH 0.87 07/26/2015   FREET4 1.05 09/01/2021   FREET4 1.06 03/29/2020   FREET4 0.99 05/20/2019   FREET4 1.07 04/29/2018   FREET4 1.07 08/09/2017   FREET4 0.20 (L) 03/01/2014   FREET4 0.30 (L) 02/22/2014   FREET4 0.44 (L) 02/17/2014   FREET4 0.92 02/11/2014   FREET4 0.66 10/12/2013  Pt describes: - fatigue - cold intolerance - constipation - dry skin - hair loss - depression  She has problems with weight - losing and gaining weight - varies by ~5 lbs.  She has + FH of thyroid disorders in: goiter in father; Hashimoto's ds. In daughter. No FH of thyroid cancer.  No h/o radiation tx to head or neck except for the RAI tx..  ROS: + see HPI  Past Medical History:  Diagnosis Date   Cancer (HCC)    PAPILLARY THYROID CANCER    Headache(784.0)    NOT MIGRAINES   Right thyroid nodule    REMOVED SURGICALLY 12/29/13 - DX WITH PAPILLARY THYROID CANCER - IS SCHEDULED FOR COMPLETION THYROIDECTOMY   Past Surgical History:  Procedure Laterality Date   CESAREAN SECTION     GALLBLADDER SURGERY     THYROID LOBECTOMY Right 12/29/2013   Procedure: RIGHT THYROID LOBECTOMY;  Surgeon: Atilano Ina, MD;  Location: [facility];  Service: General;  Laterality: Right;   THYROIDECTOMY N/A 01/20/2014   Procedure: COMPLETION THYROIDECTOMY;  Surgeon: Atilano Ina, MD;  Location: [facility];  Service: General;  Laterality: N/A;   WISDOM TOOTH EXTRACTION     Social History   Socioeconomic History   Marital status: Single    Spouse name: Not on file   Number of children: Not on file   Years of education: Not on file   Highest education level: Not on file  Occupational History   Not on file  Tobacco Use   Smoking status: Every Day    Packs/day: 0.50    Years: 9.00    Additional pack years: 0.00    Total pack years: 4.50    Types: Cigarettes   Smokeless tobacco: Never  Substance and Sexual Activity   Alcohol use: No   Drug use: No   Sexual activity: Not on file  Other Topics Concern   Not on file  Social History Narrative   Not on file   Social Determinants of Health   Financial Resource Strain: Not on file  Food Insecurity: Not on file  Transportation Needs: Not on file  Physical Activity: Not on file  Stress: Not on file  Social Connections: Not on file  Intimate Partner Violence: Not on file   Current Outpatient Medications on File Prior to Visit  Medication Sig Dispense Refill   cyclobenzaprine (FLEXERIL) 10 MG tablet Take 10 mg by mouth 3 (three) times daily as needed for muscle spasms.     HYDROcodone-acetaminophen (NORCO/VICODIN) 5-325 MG tablet Take 1 tablet by mouth as needed for moderate pain.      ibuprofen (ADVIL,MOTRIN) 800 MG tablet Take 800 mg by mouth every 8 (eight) hours as needed for mild pain or  moderate pain.     levothyroxine (SYNTHROID) 150 MCG tablet Take 1 tablet (150 mcg total) by mouth daily before breakfast. 90 tablet 3   rosuvastatin (CRESTOR) 10 MG tablet Take 10 mg by mouth daily.     No current facility-administered medications on file prior to visit.   Allergies  Allergen Reactions   Amoxicillin-Pot Clavulanate Diarrhea and Nausea And Vomiting    Pt stated "non-stop diarrhea"    Morphine Other (See Comments)    Pt stated "I would rather not take- it doesn't make me feel normal" Pt stated "I would rather not take- it doesn't make me feel normal"    Family History  Problem Relation Age of Onset   Hypertension Father    Heart disease  Father    PE: BP 120/74 (BP Location: Right Arm, Patient Position: Sitting, Cuff Size: Normal)   Pulse 63   Ht 5\' 4"  (1.626 m)   Wt 210 lb 6.4 oz (95.4 kg)   SpO2 99%   BMI 36.12 kg/m  Wt Readings from Last 3 Encounters:  11/16/22 210 lb 6.4 oz (95.4 kg)  09/01/21 205 lb 9.6 oz (93.3 kg)  03/29/20 198 lb 6.4 oz (90 kg)   Constitutional: overweight, in NAD Eyes:  EOMI, no exophthalmos ENT: no neck masses, thyroidectomy scar healed, no cervical lymphadenopathy Cardiovascular: RRR, No MRG Respiratory: CTA B Musculoskeletal: no deformities Skin:no rashes Neurological: no tremor with outstretched hands  ASSESSMENT: 1. Thyroid cancer - see HPI  2. Postsurgical Hypothyroidism  PLAN:  1. Thyroid cancer - papillary - I had a long discussion with the patient about her thyroid cancer. We reviewed together the previous investigation along with the pathology >> she is clinically stage 1 TNM due to age - I reassured her that papillary thyroid cancer is a slow growing cancer with good prognosis; her life expectancy or quality of life is unlikely to be reduced due to the cancer.  - since her tumor was 2.2 cm in size, it qualifies for RAI treatment, which she had in 2015.  The whole-body scan obtained after treatment showed uptake  in the thyroid remnant, however, it could not exclude a small lymph node metastasis.  She did not have further imaging tests to evaluate for this.  Fortunately, her thyroglobulin levels have been fluctuating between undetectable and very low, at 0.1. - we will recheck a thyroglobulin and ATA antibodies today.   - we also discussed about checking a new neck ultrasound.  Since she lives an hour away, she will stop with the previous pregnancy to see if the ultrasound could be done today.  If not, we may need to do this in [place] and have her send me the CD.  We discussed that if an abnormal lymph node is found, she may need biopsy and simply subsequent surgery.  She is reticent to have a biopsy due to increased pain at the time of her previous biopsy.  We discussed that this is not necessarily a recurrent problem. - I will then see the patient in approximately 1 year, as she mentions that it is difficult for her to drive to [place].  2.  Patient with h/o total thyroidectomy for cancer, now with iatrogenic hypothyroidism, on levothyroxine therapy.  - latest thyroid labs reviewed with pt. >> normal: Lab Results  Component Value Date   TSH 0.41 09/01/2021  - she continues on LT4 150 mcg daily - pt feels good on this dose. - we discussed about taking the thyroid hormone every day, with water, >30 minutes before breakfast, separated by >4 hours from acid reflux medications, calcium, iron, multivitamins. Pt. is taking it correctly, except she took multivitamins only 2 hours after LT4 inconsistently.  We discussed to move down at least 4 hours after LT4. - will check thyroid tests today: TSH and fT4 - If labs are abnormal, she will need to return for repeat TFTs in 1.5 months  Orders Placed This Encounter  Procedures   US THYROID   TSH   T4, free   Thyroglobulin antibody   Thyroglobulin Level   - Total time spent for the visit: 40 min, in precharting, postcharting, reviewing Dr. George Hugh last  note, obtaining medical information from the chart and from the pt, reviewing her  previous labs, evaluations, and treatments, reviewing her symptoms, counseling her about her thyroid conditions (please see the discussed topics above), and developing a plan to further investigate and treat them.  Needs refills.  Component     Latest Ref Rng 11/16/2022  TSH     0.35 - 5.50 uIU/mL 0.25 (L)   T4,Free(Direct)     0.60 - 1.60 ng/dL 1.61   TSH is slightly low.  Will reduce her dose of sent to 137 mcg daily and have her back for labs in 1.5 months.  Thyroglobulin and ATA antibodies pending.  Lisa Pavlov, MD PhD [facility]

## 2022-11-16 NOTE — Patient Instructions (Addendum)
Please continue levothyroxine 150 mcg daily.  Take the thyroid hormone every day, with water, at least 30 minutes before breakfast, separated by at least 4 hours from: - acid reflux medications - calcium - iron - multivitamins  Please stop at the lab.  Let's schedule a neck ultrasound-in [facility].  Please return for a follow-up appointment in 1 year.

## 2022-11-19 LAB — THYROGLOBULIN LEVEL: Thyroglobulin: 0.1 ng/mL — ABNORMAL LOW

## 2022-11-19 LAB — THYROGLOBULIN ANTIBODY: Thyroglobulin Ab: <1 IU/mL (ref <=1)

## 2023-08-09 ENCOUNTER — Other Ambulatory Visit: Payer: Self-pay | Admitting: Internal Medicine

## 2023-08-09 DIAGNOSIS — E890 Postprocedural hypothyroidism: Secondary | ICD-10-CM

## 2023-11-18 ENCOUNTER — Ambulatory Visit: Payer: Self-pay | Admitting: Internal Medicine

## 2024-02-15 ENCOUNTER — Other Ambulatory Visit: Payer: Self-pay | Admitting: Internal Medicine

## 2024-02-15 DIAGNOSIS — E890 Postprocedural hypothyroidism: Secondary | ICD-10-CM
